# Patient Record
Sex: Male | Born: 1955
Health system: Southern US, Community
[De-identification: ages and names within clinical notes are randomized; demographics above are authoritative.]

## PROBLEM LIST (undated history)

## (undated) DIAGNOSIS — R9389 Abnormal findings on diagnostic imaging of other specified body structures: Secondary | ICD-10-CM

## (undated) DIAGNOSIS — E119 Type 2 diabetes mellitus without complications: Secondary | ICD-10-CM

## (undated) DIAGNOSIS — K219 Gastro-esophageal reflux disease without esophagitis: Secondary | ICD-10-CM

## (undated) DIAGNOSIS — IMO0002 Reserved for concepts with insufficient information to code with codable children: Secondary | ICD-10-CM

## (undated) DIAGNOSIS — H409 Unspecified glaucoma: Secondary | ICD-10-CM

## (undated) DIAGNOSIS — M5440 Lumbago with sciatica, unspecified side: Secondary | ICD-10-CM

## (undated) DIAGNOSIS — N189 Chronic kidney disease, unspecified: Secondary | ICD-10-CM

## (undated) DIAGNOSIS — N400 Enlarged prostate without lower urinary tract symptoms: Secondary | ICD-10-CM

## (undated) DIAGNOSIS — M329 Systemic lupus erythematosus, unspecified: Secondary | ICD-10-CM

## (undated) DIAGNOSIS — Z87442 Personal history of urinary calculi: Secondary | ICD-10-CM

## (undated) DIAGNOSIS — M359 Systemic involvement of connective tissue, unspecified: Secondary | ICD-10-CM

## (undated) DIAGNOSIS — I10 Essential (primary) hypertension: Secondary | ICD-10-CM

## (undated) DIAGNOSIS — C801 Malignant (primary) neoplasm, unspecified: Secondary | ICD-10-CM

## (undated) DIAGNOSIS — M1990 Unspecified osteoarthritis, unspecified site: Secondary | ICD-10-CM

## (undated) HISTORY — DX: Malignant (primary) neoplasm, unspecified: C80.1

## (undated) HISTORY — DX: Lumbago with sciatica, unspecified side: M54.40

## (undated) HISTORY — DX: Unspecified osteoarthritis, unspecified site: M19.90

## (undated) HISTORY — PX: TUMOR REMOVAL: SHX12

## (undated) HISTORY — PX: FOOT SURGERY: SHX648

## (undated) HISTORY — DX: Type 2 diabetes mellitus without complications: E11.9

## (undated) HISTORY — PX: COLONOSCOPY W/ POLYPECTOMY: SHX1380

---

## 2004-01-18 ENCOUNTER — Emergency Department: Payer: Self-pay | Admitting: Internal Medicine

## 2004-02-09 ENCOUNTER — Emergency Department: Payer: Self-pay | Admitting: Internal Medicine

## 2004-11-20 ENCOUNTER — Emergency Department: Payer: Self-pay | Admitting: Emergency Medicine

## 2005-06-14 ENCOUNTER — Emergency Department: Payer: Self-pay | Admitting: Emergency Medicine

## 2005-09-12 ENCOUNTER — Emergency Department: Payer: Self-pay | Admitting: Emergency Medicine

## 2005-09-25 ENCOUNTER — Emergency Department: Payer: Self-pay | Admitting: Emergency Medicine

## 2005-09-25 ENCOUNTER — Other Ambulatory Visit: Payer: Self-pay

## 2005-09-26 ENCOUNTER — Emergency Department: Payer: Self-pay | Admitting: Emergency Medicine

## 2006-06-21 ENCOUNTER — Emergency Department: Payer: Self-pay | Admitting: Emergency Medicine

## 2006-10-06 ENCOUNTER — Emergency Department: Payer: Self-pay | Admitting: Emergency Medicine

## 2006-11-04 ENCOUNTER — Inpatient Hospital Stay: Payer: Self-pay | Admitting: *Deleted

## 2006-11-04 ENCOUNTER — Other Ambulatory Visit: Payer: Self-pay

## 2006-12-23 ENCOUNTER — Emergency Department: Payer: Self-pay | Admitting: Emergency Medicine

## 2007-03-19 ENCOUNTER — Ambulatory Visit: Payer: Self-pay | Admitting: Gastroenterology

## 2007-05-18 ENCOUNTER — Ambulatory Visit: Payer: Self-pay | Admitting: Gastroenterology

## 2007-07-15 ENCOUNTER — Ambulatory Visit: Payer: Self-pay

## 2007-07-27 ENCOUNTER — Ambulatory Visit: Payer: Self-pay | Admitting: Rheumatology

## 2007-07-29 ENCOUNTER — Ambulatory Visit: Payer: Self-pay | Admitting: Rheumatology

## 2007-09-23 ENCOUNTER — Ambulatory Visit: Payer: Self-pay | Admitting: Rheumatology

## 2008-07-11 ENCOUNTER — Emergency Department: Payer: Self-pay | Admitting: Emergency Medicine

## 2008-08-01 ENCOUNTER — Ambulatory Visit: Payer: Self-pay | Admitting: Urology

## 2008-12-05 ENCOUNTER — Emergency Department: Payer: Self-pay | Admitting: Emergency Medicine

## 2008-12-07 ENCOUNTER — Emergency Department: Payer: Self-pay | Admitting: Emergency Medicine

## 2009-03-07 ENCOUNTER — Ambulatory Visit: Payer: Self-pay | Admitting: Unknown Physician Specialty

## 2009-04-04 ENCOUNTER — Ambulatory Visit: Payer: Self-pay | Admitting: Orthopedic Surgery

## 2009-05-09 ENCOUNTER — Ambulatory Visit: Payer: Self-pay | Admitting: Pain Medicine

## 2009-05-24 ENCOUNTER — Ambulatory Visit: Payer: Self-pay | Admitting: Pain Medicine

## 2009-06-02 ENCOUNTER — Emergency Department: Payer: Self-pay | Admitting: Emergency Medicine

## 2009-06-05 ENCOUNTER — Emergency Department: Payer: Self-pay | Admitting: Emergency Medicine

## 2009-06-07 ENCOUNTER — Ambulatory Visit: Payer: Self-pay | Admitting: Pain Medicine

## 2009-06-12 ENCOUNTER — Ambulatory Visit: Payer: Self-pay | Admitting: General Practice

## 2009-06-18 ENCOUNTER — Ambulatory Visit: Payer: Self-pay | Admitting: Pain Medicine

## 2009-06-25 ENCOUNTER — Ambulatory Visit: Payer: Self-pay | Admitting: Endocrinology

## 2009-09-05 ENCOUNTER — Emergency Department: Payer: Self-pay | Admitting: Internal Medicine

## 2010-03-11 ENCOUNTER — Ambulatory Visit: Payer: Self-pay | Admitting: Gastroenterology

## 2010-03-13 ENCOUNTER — Ambulatory Visit: Payer: Self-pay | Admitting: Rheumatology

## 2010-03-21 ENCOUNTER — Ambulatory Visit: Payer: Self-pay | Admitting: Rheumatology

## 2011-04-13 ENCOUNTER — Emergency Department: Payer: Self-pay | Admitting: *Deleted

## 2011-04-13 LAB — CBC
HCT: 37.3 % — ABNORMAL LOW (ref 40.0–52.0)
HGB: 12.5 g/dL — ABNORMAL LOW (ref 13.0–18.0)
MCH: 31.3 pg (ref 26.0–34.0)
MCHC: 33.5 g/dL (ref 32.0–36.0)
MCV: 93 fL (ref 80–100)
Platelet: 169 10*3/uL (ref 150–440)
RBC: 4 10*6/uL — ABNORMAL LOW (ref 4.40–5.90)
RDW: 12.6 % (ref 11.5–14.5)
WBC: 6.8 10*3/uL (ref 3.8–10.6)

## 2011-04-13 LAB — COMPREHENSIVE METABOLIC PANEL
Albumin: 4 g/dL (ref 3.4–5.0)
Alkaline Phosphatase: 69 U/L (ref 50–136)
Anion Gap: 9 (ref 7–16)
BUN: 12 mg/dL (ref 7–18)
Bilirubin,Total: 0.7 mg/dL (ref 0.2–1.0)
Calcium, Total: 8.7 mg/dL (ref 8.5–10.1)
Chloride: 107 mmol/L (ref 98–107)
Co2: 28 mmol/L (ref 21–32)
Creatinine: 1.06 mg/dL (ref 0.60–1.30)
EGFR (African American): >60
EGFR (Non-African Amer.): >60
Glucose: 94 mg/dL (ref 65–99)
Osmolality: 286 (ref 275–301)
Potassium: 3.5 mmol/L (ref 3.5–5.1)
SGOT(AST): 21 U/L (ref 15–37)
SGPT (ALT): 17 U/L
Sodium: 144 mmol/L (ref 136–145)
Total Protein: 7.8 g/dL (ref 6.4–8.2)

## 2011-04-13 LAB — TROPONIN I: Troponin-I: <0.02 ng/mL

## 2011-04-14 LAB — URINALYSIS, COMPLETE
Bacteria: NONE SEEN
Bilirubin,UR: NEGATIVE
Blood: NEGATIVE
Glucose,UR: NEGATIVE mg/dL (ref 0–75)
Leukocyte Esterase: NEGATIVE
Nitrite: NEGATIVE
Ph: 6 (ref 4.5–8.0)
Protein: 30
RBC,UR: 1 /HPF (ref 0–5)
Specific Gravity: 1.027 (ref 1.003–1.030)
Squamous Epithelial: <1
WBC UR: 1 /HPF (ref 0–5)

## 2011-05-06 ENCOUNTER — Emergency Department: Payer: Self-pay | Admitting: Emergency Medicine

## 2011-06-09 ENCOUNTER — Ambulatory Visit: Payer: Self-pay | Admitting: Gastroenterology

## 2011-06-09 LAB — HEPATIC FUNCTION PANEL A (ARMC)
Albumin: 4 g/dL (ref 3.4–5.0)
Alkaline Phosphatase: 72 U/L (ref 50–136)
Bilirubin, Direct: <0.1 mg/dL (ref 0.00–0.20)
Bilirubin,Total: 0.5 mg/dL (ref 0.2–1.0)
SGOT(AST): 20 U/L (ref 15–37)
SGPT (ALT): 22 U/L
Total Protein: 7.8 g/dL (ref 6.4–8.2)

## 2011-06-10 LAB — PATHOLOGY REPORT

## 2011-06-20 ENCOUNTER — Emergency Department: Payer: Self-pay | Admitting: Emergency Medicine

## 2011-06-20 LAB — URINALYSIS, COMPLETE
Bacteria: NONE SEEN
Bilirubin,UR: NEGATIVE
Glucose,UR: NEGATIVE mg/dL (ref 0–75)
Ketone: NEGATIVE
Leukocyte Esterase: NEGATIVE
Nitrite: NEGATIVE
Ph: 8 (ref 4.5–8.0)
Protein: NEGATIVE
RBC,UR: 396 /HPF (ref 0–5)
Specific Gravity: 1.018 (ref 1.003–1.030)
Squamous Epithelial: 1
WBC UR: 1 /HPF (ref 0–5)

## 2012-03-18 ENCOUNTER — Ambulatory Visit: Payer: Self-pay | Admitting: Urology

## 2013-04-11 DIAGNOSIS — D170 Benign lipomatous neoplasm of skin and subcutaneous tissue of head, face and neck: Secondary | ICD-10-CM | POA: Insufficient documentation

## 2013-04-11 DIAGNOSIS — N200 Calculus of kidney: Secondary | ICD-10-CM | POA: Insufficient documentation

## 2013-04-11 DIAGNOSIS — M329 Systemic lupus erythematosus, unspecified: Secondary | ICD-10-CM | POA: Insufficient documentation

## 2013-04-11 DIAGNOSIS — H409 Unspecified glaucoma: Secondary | ICD-10-CM | POA: Insufficient documentation

## 2013-04-11 DIAGNOSIS — D1720 Benign lipomatous neoplasm of skin and subcutaneous tissue of unspecified limb: Secondary | ICD-10-CM | POA: Insufficient documentation

## 2013-04-11 DIAGNOSIS — H209 Unspecified iridocyclitis: Secondary | ICD-10-CM | POA: Insufficient documentation

## 2013-04-11 DIAGNOSIS — E1122 Type 2 diabetes mellitus with diabetic chronic kidney disease: Secondary | ICD-10-CM | POA: Insufficient documentation

## 2013-04-11 DIAGNOSIS — IMO0002 Reserved for concepts with insufficient information to code with codable children: Secondary | ICD-10-CM | POA: Insufficient documentation

## 2014-10-07 HISTORY — PX: PROSTATE BIOPSY: SHX241

## 2014-10-16 ENCOUNTER — Ambulatory Visit: Payer: Self-pay | Admitting: Physician Assistant

## 2014-10-18 ENCOUNTER — Encounter: Payer: Self-pay | Admitting: Vascular Surgery

## 2014-10-18 ENCOUNTER — Telehealth: Payer: Self-pay | Admitting: Vascular Surgery

## 2014-10-18 NOTE — Telephone Encounter (Signed)
-----   Message from Denman George, RN sent at 10/17/2014 12:12 PM EDT ----- Regarding: needs consult with Dr. Scot Dock Please schedule this pt. for new pt. Consult with Dr. Scot Dock, prior to ALIF on 11/09/14.  Dr. Scot Dock will be assisting Dr. [name] with this procedure on 11/09/14.  Please remind pt. to bring a CD-Rom of his LS Spine films.

## 2014-10-18 NOTE — Telephone Encounter (Signed)
Spoke with pt to r/s.

## 2014-10-19 ENCOUNTER — Encounter: Payer: Self-pay | Admitting: Vascular Surgery

## 2014-10-19 ENCOUNTER — Other Ambulatory Visit: Payer: Self-pay

## 2014-10-20 ENCOUNTER — Ambulatory Visit (INDEPENDENT_AMBULATORY_CARE_PROVIDER_SITE_OTHER): Payer: Self-pay | Admitting: Vascular Surgery

## 2014-10-20 ENCOUNTER — Encounter: Payer: Self-pay | Admitting: Vascular Surgery

## 2014-10-20 VITALS — BP 138/89 | HR 79 | Ht 71.0 in | Wt 200.0 lb

## 2014-10-20 DIAGNOSIS — M5137 Other intervertebral disc degeneration, lumbosacral region: Secondary | ICD-10-CM

## 2014-10-20 NOTE — Progress Notes (Signed)
[facility] [place]  Patient name: Nicholas Vazquez. MRN: [id number] DOB: 02-17-1955 Sex: male  REASON FOR CONSULT: Evaluate for anterior retroperitoneal exposure of L5-S1. Referred by Dr. [name].   HPI: Nicholas Vazquez. is a 59 y.o. male, who is deferred for evaluation for anterior retroperitoneal exposure of L5-S1 for an ALIF by Dr. [name]. He injured his back pushing the palate in April of this year. He's had back pain and also right sided leg pain and weakness. He has tried injection therapy, physical therapy, and pain management which has been unsuccessful. He was felt to be a good candidate for anterior lumbar interbody fusion at the L5-S1 level and we were asked to provide anterior retroperitoneal exposure.   He denies any history of claudication or rest pain. He has not had any previous abdominal surgery.  Past Medical History  Diagnosis Date  . Acute back pain with sciatica     Midline Low back- Right-side  . Diabetes mellitus without complication ([place])     Type II  . Arthritis     Lumbosacral DDD and  Rheumatoid  . Kidney stone   . Cancer [facility])     Family History  Problem Relation Age of Onset  . Diabetes Mother   . Cancer Father     SOCIAL HISTORY: Social History   Social History  . Marital Status: Single    Spouse Name: N/A  . Number of Children: N/A  . Years of Education: N/A   Occupational History  . Not on file.   Social History Main Topics  . Smoking status: Former Smoker -- 0.25 packs/day    Types: Cigarettes  . Smokeless tobacco: Never Used  . Alcohol Use: No  . Drug Use: No  . Sexual Activity: Not on file   Other Topics Concern  . Not on file   Social History Narrative    Allergies  Allergen Reactions  . Sulfabenzamide     Current Outpatient Prescriptions  Medication Sig Dispense Refill  . BRIMONIDINE TARTRATE OP Apply 0.2 % to eye 2 (two) times daily.    . cyclobenzaprine (FLEXERIL) 5 MG tablet  Take 5 mg by mouth 3 (three) times daily as needed for muscle spasms.    . Dorzolamide HCl-Timolol Mal PF 22.3-6.8 MG/ML SOLN Apply to eye.    . hydroxychloroquine (PLAQUENIL) 200 MG tablet Take 200 mg by mouth 2 (two) times daily. Sulfate  5  . latanoprost (XALATAN) 0.005 % ophthalmic solution 1 drop at bedtime.    . naproxen (NAPROSYN) 500 MG tablet TAKE ONE TABLET BY MOUTH TWICE DAILY WITH MEAL  5  . omeprazole (PRILOSEC) 40 MG capsule Take 40 mg by mouth daily.  5  . tamsulosin (FLOMAX) 0.4 MG CAPS capsule TAKE 1 CAPSULE (0.4 MG TOTAL) BY MOUTH ONCE DAILY. TAKE 30 MINUTES AFTER SAME MEAL EACH DAY.  0  . traMADol (ULTRAM) 50 MG tablet Take 50 mg by mouth 2 (two) times daily as needed. for pain  1   No current facility-administered medications for this visit.    REVIEW OF SYSTEMS:  [X]  denotes positive finding, [ ]  denotes negative finding Cardiac  Comments:  Chest pain or chest pressure:    Shortness of breath upon exertion:    Short of breath when lying flat:    Irregular heart rhythm:        Vascular    Pain in calf, thigh, or hip brought on by ambulation:    Pain  in feet at night that wakes you up from your sleep:     Blood clot in your veins:    Leg swelling:         Pulmonary    Oxygen at home:    Productive cough:     Wheezing:         Neurologic    Sudden weakness in arms or legs:     Sudden numbness in arms or legs:     Sudden onset of difficulty speaking or slurred speech:    Temporary loss of vision in one eye:     Problems with dizziness:         Gastrointestinal    Blood in stool:     Vomited blood:         Genitourinary    Burning when urinating:     Blood in urine:        Psychiatric    Major depression:         Hematologic    Bleeding problems:    Problems with blood clotting too easily:        Skin    Rashes or ulcers:        Constitutional    Fever or chills:      PHYSICAL EXAM: There were no vitals filed for this visit.  GENERAL: The  patient is a well-nourished male, in no acute distress. The vital signs are documented above. CARDIAC: There is a regular rate and rhythm.  VASCULAR: I do not detect carotid bruits. He has palpable femoral pulses and palpable pedal pulses bilaterally. He has no significant lower extremity swelling. PULMONARY: There is good air exchange bilaterally without wheezing or rales. ABDOMEN: Soft and non-tender with normal pitched bowel sounds.  MUSCULOSKELETAL: There are no major deformities or cyanosis. NEUROLOGIC: No focal weakness or paresthesias are detected. SKIN: There are no ulcers or rashes noted. PSYCHIATRIC: The patient has a normal affect.  DATA:  I have reviewed his MRI. I do not see any calcific disease in the iliac vessels. He has a moderate disc bulge at L5-S1.  MEDICAL ISSUES:  DEGENERATIVE DISC DISEASE L5-S1: He appears to be a good candidate for anterior rectoperineal exposure of L5-S1. I have reviewed our role in exposure of the spine in order to allow anterior lumbar interbody fusion at the appropriate levels. We have discussed the potential complications of surgery, including but not limited to, arterial or venous injury, thrombosis, or bleeding. We have also discussed the potential risks of wound healing problems, the development of a hernia, nerve injury, leg swelling, or other unpredictable medical problems. I've also explained that for the L5-S1 level there is a small risk of retrograde ejaculation. All the patient's questions were answered and they are agreeable to proceed.  [name] [facility] [place]: [phone]

## 2014-10-30 NOTE — Pre-Procedure Instructions (Signed)
Abdiaziz Klahn.  10/30/2014     Your procedure is scheduled on : Thursday November 09, 2014 at 8:30 AM.  Report to [facility] Admitting at 6:30 A.M.  Call this number if you have problems the morning of surgery: [phone]    Remember:  Do not eat food or drink liquids after midnight.  Take these medicines the morning of surgery with A SIP OF WATER : Omeprazole (Prilosec), Tramadol (Ultram) if needed, Eye drops   Stop taking any vitamins, herbal medications, Naproxen/Aleve, Ibuprofen, Advil, Motrin, Aleve, etc on Thursday October 27th   Do not wear jewelry.  Do not wear lotions, powders, or cologne.    Men may shave face and neck.  Do not bring valuables to the hospital.  [facility] is not responsible for any belongings or valuables.  Contacts, dentures or bridgework may not be worn into surgery.  Leave your suitcase in the car.  After surgery it may be brought to your room.  For patients admitted to the hospital, discharge time will be determined by your treatment team.  Patients discharged the day of surgery will not be allowed to drive home.   Name and phone number of your driver:    Special instructions:  Shower using CHG soap the night before and the morning of your surgery  Please read over the following fact sheets that you were given. Pain Booklet, Coughing and Deep Breathing, MRSA Information and Surgical Site Infection Prevention

## 2014-10-31 ENCOUNTER — Encounter (HOSPITAL_COMMUNITY)
Admission: RE | Admit: 2014-10-31 | Discharge: 2014-10-31 | Disposition: A | Discharge status: Home / Self Care | Payer: Worker's Compensation | Source: Ambulatory Visit | Attending: Orthopedic Surgery | Admitting: Orthopedic Surgery

## 2014-10-31 ENCOUNTER — Encounter (HOSPITAL_COMMUNITY): Payer: Self-pay

## 2014-10-31 DIAGNOSIS — Z01818 Encounter for other preprocedural examination: Secondary | ICD-10-CM | POA: Diagnosis present

## 2014-10-31 DIAGNOSIS — K219 Gastro-esophageal reflux disease without esophagitis: Secondary | ICD-10-CM | POA: Insufficient documentation

## 2014-10-31 DIAGNOSIS — H409 Unspecified glaucoma: Secondary | ICD-10-CM | POA: Diagnosis not present

## 2014-10-31 DIAGNOSIS — Z79899 Other long term (current) drug therapy: Secondary | ICD-10-CM | POA: Diagnosis not present

## 2014-10-31 DIAGNOSIS — Z01812 Encounter for preprocedural laboratory examination: Secondary | ICD-10-CM | POA: Insufficient documentation

## 2014-10-31 DIAGNOSIS — Z0183 Encounter for blood typing: Secondary | ICD-10-CM | POA: Diagnosis not present

## 2014-10-31 DIAGNOSIS — Z87891 Personal history of nicotine dependence: Secondary | ICD-10-CM | POA: Insufficient documentation

## 2014-10-31 DIAGNOSIS — E119 Type 2 diabetes mellitus without complications: Secondary | ICD-10-CM | POA: Diagnosis not present

## 2014-10-31 DIAGNOSIS — M5137 Other intervertebral disc degeneration, lumbosacral region: Secondary | ICD-10-CM | POA: Diagnosis not present

## 2014-10-31 HISTORY — DX: Systemic lupus erythematosus, unspecified: M32.9

## 2014-10-31 HISTORY — DX: Benign prostatic hyperplasia without lower urinary tract symptoms: N40.0

## 2014-10-31 HISTORY — DX: Unspecified glaucoma: H40.9

## 2014-10-31 HISTORY — DX: Reserved for concepts with insufficient information to code with codable children: IMO0002

## 2014-10-31 HISTORY — DX: Personal history of urinary calculi: Z87.442

## 2014-10-31 HISTORY — DX: Gastro-esophageal reflux disease without esophagitis: K21.9

## 2014-10-31 LAB — SURGICAL PCR SCREEN
MRSA, PCR: NEGATIVE
Staphylococcus aureus: NEGATIVE

## 2014-10-31 LAB — BASIC METABOLIC PANEL
Anion gap: 7 (ref 5–15)
BUN: 16 mg/dL (ref 6–20)
CO2: 29 mmol/L (ref 22–32)
Calcium: 9.2 mg/dL (ref 8.9–10.3)
Chloride: 106 mmol/L (ref 101–111)
Creatinine, Ser: 1.13 mg/dL (ref 0.61–1.24)
GFR calc Af Amer: >60 mL/min (ref >60)
GFR calc non Af Amer: >60 mL/min (ref >60)
Glucose, Bld: 128 mg/dL — ABNORMAL HIGH (ref 65–99)
Potassium: 4 mmol/L (ref 3.5–5.1)
Sodium: 142 mmol/L (ref 135–145)

## 2014-10-31 LAB — CBC
HCT: 37.3 % — ABNORMAL LOW (ref 39.0–52.0)
Hemoglobin: 12.8 g/dL — ABNORMAL LOW (ref 13.0–17.0)
MCH: 31.2 pg (ref 26.0–34.0)
MCHC: 34.3 g/dL (ref 30.0–36.0)
MCV: 91 fL (ref 78.0–100.0)
Platelets: 170 10*3/uL (ref 150–400)
RBC: 4.1 MIL/uL — ABNORMAL LOW (ref 4.22–5.81)
RDW: 12.7 % (ref 11.5–15.5)
WBC: 4.8 10*3/uL (ref 4.0–10.5)

## 2014-10-31 LAB — URINALYSIS, ROUTINE W REFLEX MICROSCOPIC
Bilirubin Urine: NEGATIVE
Glucose, UA: NEGATIVE mg/dL
Ketones, ur: NEGATIVE mg/dL
Leukocytes, UA: NEGATIVE
Nitrite: NEGATIVE
Protein, ur: NEGATIVE mg/dL
Specific Gravity, Urine: 1.019 (ref 1.005–1.030)
Urobilinogen, UA: 1 mg/dL (ref 0.0–1.0)
pH: 6 (ref 5.0–8.0)

## 2014-10-31 LAB — URINE MICROSCOPIC-ADD ON

## 2014-10-31 LAB — TYPE AND SCREEN
ABO/RH(D): O POS
Antibody Screen: NEGATIVE

## 2014-10-31 LAB — ABO/RH: ABO/RH(D): O POS

## 2014-10-31 NOTE — Progress Notes (Signed)
PCP is Frazier Richards. Patient informed Nurse that he had a office visit with PCP a few weeks ago. Patient informed Nurse that he had a stress test within the last five years. Will request records. Patient denied having any cardiac or pulmonary issues  Dr. Yves Dill is patients urologist. Patient informed Nurse that he recently had a prostate biopsy performed and had not received the results back. Patient stated his father died last year in this month from prostate cancer.  Patient informed Nurse that he had a UTI last week and was given antibiotics to take. Urinalysis was ordered today.  Patient denied having diabetes. Patient stated "I was getting those steroid injections, and that made my sugar go up. I was on Metformin but after my doctor stopped the injections my sugar was normal, and I was taken off the Metformin.Marland KitchenMarland KitchenI've never had any issues with my blood sugar.Marland KitchenMarland KitchenMarland KitchenI mean I'm not over weight or anything." Patient stated he does not check his blood glucose at home."

## 2014-11-01 LAB — HEMOGLOBIN A1C
Hgb A1c MFr Bld: 5.9 % — ABNORMAL HIGH (ref 4.8–5.6)
Mean Plasma Glucose: 123 mg/dL

## 2014-11-01 NOTE — Progress Notes (Addendum)
Anesthesia Chart Review:  Pt is 60 year old male scheduled for L5-S1 anterior lumbar fusion, abdominal exposure on 11/09/2014 with Dr. [name] and Dr. Scot Dock.   PMH includes: DM, lupus, glaucoma, GERD.  Former smoker. BMI 27.5.  Medications include: brimonidine ophthalmic, dorzolamide-timolol, hydroxychloroquine, latanoprost, prilosec.   Preoperative labs reviewed.  Glucose 128, hgbA1c 5.9.   EKG 10/31/2014: NSR.   Pt reports having stress test about 5 years ago. Attempting to get records.   Willeen Cass, FNP-BC [facility]/Anesthesiology Phone: [phone] 11/01/2014 4:15 PM  Addendum:   Stress echo test 04/24/11: 1. Normal treadmill ECG without evidence of ischemia or dysrhythmia 2. Excellent exercise tolerance for age 65. Normal stress echo images without evidence of ischemia  If no changes, I anticipate pt can proceed with surgery as scheduled.   Willeen Cass, FNP-BC [facility]/Anesthesiology Phone: [phone] 11/08/2014 2:28 PM

## 2014-11-02 ENCOUNTER — Encounter: Payer: Self-pay | Admitting: Orthopedic Surgery

## 2014-11-06 NOTE — Progress Notes (Signed)
Re-requested stress test from [facility] (Dr. Ouida Sills).

## 2014-11-07 DIAGNOSIS — R9389 Abnormal findings on diagnostic imaging of other specified body structures: Secondary | ICD-10-CM

## 2014-11-07 HISTORY — DX: Abnormal findings on diagnostic imaging of other specified body structures: R93.89

## 2014-11-08 MED ORDER — CHLORHEXIDINE GLUCONATE 4 % EX LIQD: 60.0000 mL | Freq: Once | CUTANEOUS | Status: DC

## 2014-11-08 MED ORDER — CEFAZOLIN SODIUM-DEXTROSE 2-3 GM-% IV SOLR
2.0000 g | INTRAVENOUS | Status: AC
  Administered 2014-11-09: 2 g via INTRAVENOUS
  Filled 2014-11-08: qty 50

## 2014-11-09 ENCOUNTER — Inpatient Hospital Stay (HOSPITAL_COMMUNITY): Payer: Worker's Compensation

## 2014-11-09 ENCOUNTER — Encounter (HOSPITAL_COMMUNITY)
Admission: RE | Disposition: A | Discharge status: Home Health | Payer: Self-pay | Source: Ambulatory Visit | Attending: Orthopedic Surgery

## 2014-11-09 ENCOUNTER — Inpatient Hospital Stay (HOSPITAL_COMMUNITY)
Admission: RE | Admit: 2014-11-09 | Discharge: 2014-11-12 | DRG: 460 | Disposition: A | Discharge status: Home Health | Payer: Worker's Compensation | Source: Ambulatory Visit | Attending: Orthopedic Surgery | Admitting: Orthopedic Surgery

## 2014-11-09 ENCOUNTER — Encounter (HOSPITAL_COMMUNITY): Payer: Self-pay | Admitting: Surgery

## 2014-11-09 ENCOUNTER — Inpatient Hospital Stay (HOSPITAL_COMMUNITY): Payer: Worker's Compensation | Admitting: Emergency Medicine

## 2014-11-09 ENCOUNTER — Inpatient Hospital Stay (HOSPITAL_COMMUNITY): Payer: Worker's Compensation | Admitting: Anesthesiology

## 2014-11-09 DIAGNOSIS — M5137 Other intervertebral disc degeneration, lumbosacral region: Principal | ICD-10-CM | POA: Diagnosis present

## 2014-11-09 DIAGNOSIS — M069 Rheumatoid arthritis, unspecified: Secondary | ICD-10-CM | POA: Diagnosis present

## 2014-11-09 DIAGNOSIS — M5136 Other intervertebral disc degeneration, lumbar region: Secondary | ICD-10-CM

## 2014-11-09 DIAGNOSIS — M549 Dorsalgia, unspecified: Secondary | ICD-10-CM | POA: Diagnosis present

## 2014-11-09 DIAGNOSIS — Z419 Encounter for procedure for purposes other than remedying health state, unspecified: Secondary | ICD-10-CM

## 2014-11-09 DIAGNOSIS — L930 Discoid lupus erythematosus: Secondary | ICD-10-CM | POA: Diagnosis present

## 2014-11-09 DIAGNOSIS — Z87891 Personal history of nicotine dependence: Secondary | ICD-10-CM

## 2014-11-09 DIAGNOSIS — Z9889 Other specified postprocedural states: Secondary | ICD-10-CM

## 2014-11-09 DIAGNOSIS — E119 Type 2 diabetes mellitus without complications: Secondary | ICD-10-CM | POA: Diagnosis present

## 2014-11-09 DIAGNOSIS — M5430 Sciatica, unspecified side: Secondary | ICD-10-CM | POA: Diagnosis present

## 2014-11-09 HISTORY — DX: Abnormal findings on diagnostic imaging of other specified body structures: R93.89

## 2014-11-09 HISTORY — PX: ANTERIOR LUMBAR FUSION: SHX1170

## 2014-11-09 HISTORY — PX: ABDOMINAL EXPOSURE: SHX5708

## 2014-11-09 LAB — GLUCOSE, CAPILLARY
Glucose-Capillary: 156 mg/dL — ABNORMAL HIGH (ref 65–99)
Glucose-Capillary: 120 mg/dL — ABNORMAL HIGH (ref 65–99)

## 2014-11-09 SURGERY — ANTERIOR LUMBAR FUSION 1 LEVEL
Anesthesia: General | Site: Spine Lumbar

## 2014-11-09 MED ORDER — SODIUM CHLORIDE 0.9 % IJ SOLN
3.0000 mL | INTRAMUSCULAR | Status: DC | PRN
  Administered 2014-11-10 – 2014-11-11 (×2): 3 mL via INTRAVENOUS
  Filled 2014-11-09 (×2): qty 3

## 2014-11-09 MED ORDER — DEXTROSE 5 % IV SOLN
10.0000 mg | INTRAVENOUS | Status: DC | PRN
  Administered 2014-11-09: 40 ug/min via INTRAVENOUS

## 2014-11-09 MED ORDER — FENTANYL CITRATE (PF) 250 MCG/5ML IJ SOLN
INTRAMUSCULAR | Status: AC
  Filled 2014-11-09: qty 5

## 2014-11-09 MED ORDER — GLYCOPYRROLATE 0.2 MG/ML IJ SOLN
INTRAMUSCULAR | Status: DC | PRN
  Administered 2014-11-09 (×2): 0.4 mg via INTRAVENOUS

## 2014-11-09 MED ORDER — TAMSULOSIN HCL 0.4 MG PO CAPS
0.4000 mg | ORAL_CAPSULE | Freq: Every day | ORAL | Status: DC
  Administered 2014-11-09 – 2014-11-12 (×4): 0.4 mg via ORAL
  Filled 2014-11-09 (×4): qty 1

## 2014-11-09 MED ORDER — ONDANSETRON HCL 4 MG/2ML IJ SOLN: 4.0000 mg | INTRAMUSCULAR | Status: DC | PRN

## 2014-11-09 MED ORDER — DEXAMETHASONE 4 MG PO TABS
4.0000 mg | ORAL_TABLET | Freq: Four times a day (QID) | ORAL | Status: DC
  Administered 2014-11-09: 4 mg via ORAL
  Filled 2014-11-09: qty 1

## 2014-11-09 MED ORDER — PHENOL 1.4 % MT LIQD
1.0000 | OROMUCOSAL | Status: DC | PRN
  Administered 2014-11-09: 1 via OROMUCOSAL
  Filled 2014-11-09: qty 177

## 2014-11-09 MED ORDER — THROMBIN 20000 UNITS EX SOLR
CUTANEOUS | Status: DC | PRN
  Administered 2014-11-09: 20 mL via TOPICAL

## 2014-11-09 MED ORDER — MAGNESIUM CITRATE PO SOLN
0.5000 | Freq: Once | ORAL | Status: AC
  Administered 2014-11-10: 0.5 via ORAL
  Filled 2014-11-09: qty 296

## 2014-11-09 MED ORDER — ACETAMINOPHEN 160 MG/5ML PO SOLN
325.0000 mg | ORAL | Status: DC | PRN
  Filled 2014-11-09: qty 20.3

## 2014-11-09 MED ORDER — ACETAMINOPHEN 10 MG/ML IV SOLN
INTRAVENOUS | Status: DC | PRN
  Administered 2014-11-09: 1000 mg via INTRAVENOUS

## 2014-11-09 MED ORDER — METHOCARBAMOL 1000 MG/10ML IJ SOLN
500.0000 mg | Freq: Once | INTRAVENOUS | Status: AC
  Administered 2014-11-09: 500 mg via INTRAVENOUS
  Filled 2014-11-09: qty 5

## 2014-11-09 MED ORDER — ENOXAPARIN SODIUM 30 MG/0.3ML ~~LOC~~ SOLN: 30.0000 mg | Freq: Two times a day (BID) | SUBCUTANEOUS | Status: DC

## 2014-11-09 MED ORDER — OXYCODONE-ACETAMINOPHEN 10-325 MG PO TABS: 1.0000 | ORAL_TABLET | ORAL | Status: DC | PRN

## 2014-11-09 MED ORDER — OXYCODONE HCL 5 MG/5ML PO SOLN: 5.0000 mg | Freq: Once | ORAL | Status: AC | PRN

## 2014-11-09 MED ORDER — LIDOCAINE HCL (CARDIAC) 20 MG/ML IV SOLN
INTRAVENOUS | Status: DC | PRN
  Administered 2014-11-09: 60 mg via INTRAVENOUS

## 2014-11-09 MED ORDER — METHOCARBAMOL 500 MG PO TABS: 500.0000 mg | ORAL_TABLET | Freq: Three times a day (TID) | ORAL | Status: DC | PRN

## 2014-11-09 MED ORDER — ARTIFICIAL TEARS OP OINT
TOPICAL_OINTMENT | OPHTHALMIC | Status: AC
  Filled 2014-11-09: qty 3.5

## 2014-11-09 MED ORDER — HYDROMORPHONE HCL 1 MG/ML IJ SOLN
0.2500 mg | INTRAMUSCULAR | Status: DC | PRN
  Administered 2014-11-09 (×4): 0.5 mg via INTRAVENOUS

## 2014-11-09 MED ORDER — OXYCODONE HCL 5 MG PO TABS
ORAL_TABLET | ORAL | Status: AC
  Filled 2014-11-09: qty 1

## 2014-11-09 MED ORDER — ONDANSETRON HCL 4 MG/2ML IJ SOLN
INTRAMUSCULAR | Status: AC
  Filled 2014-11-09: qty 2

## 2014-11-09 MED ORDER — FLEET ENEMA 7-19 GM/118ML RE ENEM
1.0000 | ENEMA | Freq: Once | RECTAL | Status: AC
  Administered 2014-11-10: 1 via RECTAL
  Filled 2014-11-09: qty 1

## 2014-11-09 MED ORDER — POLYETHYLENE GLYCOL 3350 17 GM/SCOOP PO POWD: 17.0000 g | Freq: Every day | ORAL | Status: DC

## 2014-11-09 MED ORDER — LIDOCAINE HCL (CARDIAC) 20 MG/ML IV SOLN
INTRAVENOUS | Status: AC
  Filled 2014-11-09: qty 5

## 2014-11-09 MED ORDER — HEMOSTATIC AGENTS (NO CHARGE) OPTIME
TOPICAL | Status: DC | PRN
  Administered 2014-11-09: 1 via TOPICAL

## 2014-11-09 MED ORDER — DOCUSATE SODIUM 100 MG PO CAPS: 100.0000 mg | ORAL_CAPSULE | Freq: Three times a day (TID) | ORAL | Status: DC | PRN

## 2014-11-09 MED ORDER — FENTANYL CITRATE (PF) 100 MCG/2ML IJ SOLN
INTRAMUSCULAR | Status: DC | PRN
  Administered 2014-11-09 (×2): 50 ug via INTRAVENOUS
  Administered 2014-11-09 (×2): 100 ug via INTRAVENOUS

## 2014-11-09 MED ORDER — OXYCODONE HCL 5 MG PO TABS
10.0000 mg | ORAL_TABLET | ORAL | Status: DC | PRN
  Administered 2014-11-09 – 2014-11-12 (×14): 10 mg via ORAL
  Filled 2014-11-09 (×14): qty 2

## 2014-11-09 MED ORDER — DEXAMETHASONE SODIUM PHOSPHATE 10 MG/ML IJ SOLN
INTRAMUSCULAR | Status: AC
  Filled 2014-11-09: qty 1

## 2014-11-09 MED ORDER — 0.9 % SODIUM CHLORIDE (POUR BTL) OPTIME
TOPICAL | Status: DC | PRN
  Administered 2014-11-09: 1000 mL

## 2014-11-09 MED ORDER — ALBUMIN HUMAN 5 % IV SOLN
INTRAVENOUS | Status: DC | PRN
  Administered 2014-11-09: 11:00:00 via INTRAVENOUS

## 2014-11-09 MED ORDER — LACTATED RINGERS IV SOLN
INTRAVENOUS | Status: DC
  Administered 2014-11-09: 23:00:00 via INTRAVENOUS

## 2014-11-09 MED ORDER — MIDAZOLAM HCL 5 MG/5ML IJ SOLN
INTRAMUSCULAR | Status: DC | PRN
  Administered 2014-11-09: 0.5 mg via INTRAVENOUS
  Administered 2014-11-09: 1 mg via INTRAVENOUS
  Administered 2014-11-09: 0.5 mg via INTRAVENOUS

## 2014-11-09 MED ORDER — GLYCOPYRROLATE 0.2 MG/ML IJ SOLN
INTRAMUSCULAR | Status: AC
  Filled 2014-11-09: qty 4

## 2014-11-09 MED ORDER — PROPOFOL 10 MG/ML IV BOLUS
INTRAVENOUS | Status: AC
  Filled 2014-11-09: qty 20

## 2014-11-09 MED ORDER — OXYCODONE HCL 5 MG PO TABS
5.0000 mg | ORAL_TABLET | Freq: Once | ORAL | Status: AC | PRN
  Administered 2014-11-09: 5 mg via ORAL

## 2014-11-09 MED ORDER — THROMBIN 20000 UNITS EX SOLR
CUTANEOUS | Status: AC
  Filled 2014-11-09: qty 20000

## 2014-11-09 MED ORDER — ROCURONIUM BROMIDE 100 MG/10ML IV SOLN
INTRAVENOUS | Status: DC | PRN
  Administered 2014-11-09: 10 mg via INTRAVENOUS
  Administered 2014-11-09: 50 mg via INTRAVENOUS
  Administered 2014-11-09 (×4): 10 mg via INTRAVENOUS

## 2014-11-09 MED ORDER — NEOSTIGMINE METHYLSULFATE 10 MG/10ML IV SOLN
INTRAVENOUS | Status: AC
  Filled 2014-11-09: qty 1

## 2014-11-09 MED ORDER — METHOCARBAMOL 500 MG PO TABS
500.0000 mg | ORAL_TABLET | Freq: Four times a day (QID) | ORAL | Status: DC | PRN
  Administered 2014-11-09 – 2014-11-12 (×6): 500 mg via ORAL
  Filled 2014-11-09 (×6): qty 1

## 2014-11-09 MED ORDER — NEOSTIGMINE METHYLSULFATE 10 MG/10ML IV SOLN
INTRAVENOUS | Status: DC | PRN
  Administered 2014-11-09 (×2): 2.5 mg via INTRAVENOUS

## 2014-11-09 MED ORDER — BUPIVACAINE-EPINEPHRINE 0.25% -1:200000 IJ SOLN
INTRAMUSCULAR | Status: DC | PRN
  Administered 2014-11-09: 30 mL

## 2014-11-09 MED ORDER — MORPHINE SULFATE (PF) 2 MG/ML IV SOLN
1.0000 mg | INTRAVENOUS | Status: DC | PRN
  Administered 2014-11-09 – 2014-11-10 (×4): 4 mg via INTRAVENOUS
  Filled 2014-11-09 (×4): qty 2

## 2014-11-09 MED ORDER — MENTHOL 3 MG MT LOZG: 1.0000 | LOZENGE | OROMUCOSAL | Status: DC | PRN

## 2014-11-09 MED ORDER — HYDROMORPHONE HCL 1 MG/ML IJ SOLN
INTRAMUSCULAR | Status: AC
  Filled 2014-11-09: qty 1

## 2014-11-09 MED ORDER — LACTATED RINGERS IV SOLN
INTRAVENOUS | Status: DC | PRN
  Administered 2014-11-09 (×3): via INTRAVENOUS

## 2014-11-09 MED ORDER — ONDANSETRON HCL 4 MG/2ML IJ SOLN
INTRAMUSCULAR | Status: DC | PRN
  Administered 2014-11-09: 4 mg via INTRAVENOUS

## 2014-11-09 MED ORDER — LEVOFLOXACIN 500 MG PO TABS: 500.0000 mg | ORAL_TABLET | Freq: Every day | ORAL | Status: DC

## 2014-11-09 MED ORDER — CEFAZOLIN SODIUM 1-5 GM-% IV SOLN
1.0000 g | Freq: Three times a day (TID) | INTRAVENOUS | Status: AC
  Administered 2014-11-09 (×2): 1 g via INTRAVENOUS
  Filled 2014-11-09 (×2): qty 50

## 2014-11-09 MED ORDER — BUPIVACAINE-EPINEPHRINE (PF) 0.25% -1:200000 IJ SOLN
INTRAMUSCULAR | Status: AC
  Filled 2014-11-09: qty 30

## 2014-11-09 MED ORDER — SODIUM CHLORIDE 0.9 % IV SOLN
250.0000 mL | INTRAVENOUS | Status: DC
Start: 2014-11-09 — End: 2014-11-12
  Administered 2014-11-09: 250 mL via INTRAVENOUS

## 2014-11-09 MED ORDER — ACETAMINOPHEN 325 MG PO TABS: 325.0000 mg | ORAL_TABLET | ORAL | Status: DC | PRN

## 2014-11-09 MED ORDER — METHOCARBAMOL 1000 MG/10ML IJ SOLN
500.0000 mg | Freq: Four times a day (QID) | INTRAVENOUS | Status: DC | PRN
  Filled 2014-11-09: qty 5

## 2014-11-09 MED ORDER — MIDAZOLAM HCL 2 MG/2ML IJ SOLN
INTRAMUSCULAR | Status: AC
  Filled 2014-11-09: qty 4

## 2014-11-09 MED ORDER — LATANOPROST 0.005 % OP SOLN
1.0000 [drp] | Freq: Every day | OPHTHALMIC | Status: DC
  Administered 2014-11-09 – 2014-11-11 (×3): 1 [drp] via OPHTHALMIC
  Filled 2014-11-09: qty 2.5

## 2014-11-09 MED ORDER — HYDROXYCHLOROQUINE SULFATE 200 MG PO TABS
200.0000 mg | ORAL_TABLET | Freq: Two times a day (BID) | ORAL | Status: DC
  Administered 2014-11-09: 200 mg via ORAL
  Filled 2014-11-09: qty 1

## 2014-11-09 MED ORDER — ACETAMINOPHEN 10 MG/ML IV SOLN
1000.0000 mg | Freq: Once | INTRAVENOUS | Status: DC
  Filled 2014-11-09: qty 100

## 2014-11-09 MED ORDER — ROCURONIUM BROMIDE 50 MG/5ML IV SOLN
INTRAVENOUS | Status: AC
  Filled 2014-11-09: qty 2

## 2014-11-09 MED ORDER — DEXAMETHASONE SODIUM PHOSPHATE 10 MG/ML IJ SOLN
INTRAMUSCULAR | Status: DC | PRN
  Administered 2014-11-09: 10 mg via INTRAVENOUS

## 2014-11-09 MED ORDER — DORZOLAMIDE HCL-TIMOLOL MAL 2-0.5 % OP SOLN
1.0000 [drp] | Freq: Every day | OPHTHALMIC | Status: DC
  Administered 2014-11-09: 1 [drp] via OPHTHALMIC
  Filled 2014-11-09: qty 10

## 2014-11-09 MED ORDER — ENOXAPARIN SODIUM 40 MG/0.4ML ~~LOC~~ SOLN
40.0000 mg | SUBCUTANEOUS | Status: DC
  Administered 2014-11-09 – 2014-11-12 (×3): 40 mg via SUBCUTANEOUS
  Filled 2014-11-09 (×3): qty 0.4

## 2014-11-09 MED ORDER — PROPOFOL 10 MG/ML IV BOLUS
INTRAVENOUS | Status: DC | PRN
  Administered 2014-11-09: 140 mg via INTRAVENOUS

## 2014-11-09 MED ORDER — DEXAMETHASONE SODIUM PHOSPHATE 4 MG/ML IJ SOLN
4.0000 mg | Freq: Four times a day (QID) | INTRAMUSCULAR | Status: DC
  Administered 2014-11-09 – 2014-11-12 (×9): 4 mg via INTRAVENOUS
  Filled 2014-11-09 (×10): qty 1

## 2014-11-09 MED ORDER — SODIUM CHLORIDE 0.9 % IJ SOLN
3.0000 mL | Freq: Two times a day (BID) | INTRAMUSCULAR | Status: DC
  Administered 2014-11-10 – 2014-11-11 (×3): 3 mL via INTRAVENOUS

## 2014-11-09 MED ORDER — ONDANSETRON HCL 4 MG PO TABS: 4.0000 mg | ORAL_TABLET | Freq: Three times a day (TID) | ORAL | Status: DC | PRN

## 2014-11-09 MED ORDER — BRIMONIDINE TARTRATE 0.2 % OP SOLN
1.0000 [drp] | Freq: Two times a day (BID) | OPHTHALMIC | Status: DC
  Administered 2014-11-09: 1 [drp] via OPHTHALMIC
  Filled 2014-11-09: qty 5

## 2014-11-09 SURGICAL SUPPLY — 99 items
APPLIER CLIP 11 MED OPEN (CLIP) ×8
BAG DECANTER FOR FLEXI CONT (MISCELLANEOUS) ×4 IMPLANT
BAG URIMETER 350ML BARDEX IC (UROLOGICAL SUPPLIES)
BAG URIMETER BARDEX IC 350 (UROLOGICAL SUPPLIES) IMPLANT
BLADE SURG 10 STRL SS (BLADE) ×4 IMPLANT
BLADE SURG ROTATE 9660 (MISCELLANEOUS) ×4 IMPLANT
BONE MATRIX VIVIGEN 5CC (Bone Implant) ×4 IMPLANT
CLIP APPLIE 11 MED OPEN (CLIP) ×4 IMPLANT
CLOSURE STERI-STRIP 1/2X4 (GAUZE/BANDAGES/DRESSINGS) ×1
CLOSURE WOUND 1/2 X4 (GAUZE/BANDAGES/DRESSINGS)
CLSR STERI-STRIP ANTIMIC 1/2X4 (GAUZE/BANDAGES/DRESSINGS) ×3 IMPLANT
CORDS BIPOLAR (ELECTRODE) ×4 IMPLANT
COVER SURGICAL LIGHT HANDLE (MISCELLANEOUS) ×4 IMPLANT
COVER TABLE BACK 60X90 (DRAPES) ×4 IMPLANT
DECANTER SPIKE VIAL GLASS SM (MISCELLANEOUS) ×4 IMPLANT
DERMABOND ADVANCED (GAUZE/BANDAGES/DRESSINGS) ×2
DERMABOND ADVANCED .7 DNX12 (GAUZE/BANDAGES/DRESSINGS) ×2 IMPLANT
DRAPE C-ARM 42X72 X-RAY (DRAPES) ×8 IMPLANT
DRAPE INCISE IOBAN 66X45 STRL (DRAPES) ×4 IMPLANT
DRAPE POUCH INSTRU U-SHP 10X18 (DRAPES) ×4 IMPLANT
DRAPE SURG 17X23 STRL (DRAPES) ×4 IMPLANT
DRAPE U-SHAPE 47X51 STRL (DRAPES) ×4 IMPLANT
DRSG MEPILEX BORDER 4X8 (GAUZE/BANDAGES/DRESSINGS) ×4 IMPLANT
DURAPREP 26ML APPLICATOR (WOUND CARE) ×4 IMPLANT
ELECT BLADE 4.0 EZ CLEAN MEGAD (MISCELLANEOUS) ×4
ELECT CAUTERY BLADE 6.4 (BLADE) ×4 IMPLANT
ELECT PENCIL ROCKER SW 15FT (MISCELLANEOUS) ×4 IMPLANT
ELECT REM PT RETURN 9FT ADLT (ELECTROSURGICAL) ×4
ELECTRODE BLDE 4.0 EZ CLN MEGD (MISCELLANEOUS) ×2 IMPLANT
ELECTRODE REM PT RTRN 9FT ADLT (ELECTROSURGICAL) ×2 IMPLANT
GAUZE SPONGE 4X4 16PLY XRAY LF (GAUZE/BANDAGES/DRESSINGS) IMPLANT
GLOVE BIO SURGEON STRL SZ7.5 (GLOVE) IMPLANT
GLOVE BIOGEL PI IND STRL 8 (GLOVE) ×4 IMPLANT
GLOVE BIOGEL PI IND STRL 8.5 (GLOVE) ×4 IMPLANT
GLOVE BIOGEL PI INDICATOR 8 (GLOVE) ×4
GLOVE BIOGEL PI INDICATOR 8.5 (GLOVE) ×4
GLOVE ECLIPSE 7.5 STRL STRAW (GLOVE) ×4 IMPLANT
GLOVE ORTHO TXT STRL SZ7.5 (GLOVE) ×4 IMPLANT
GLOVE SS BIOGEL STRL SZ 8.5 (GLOVE) ×4 IMPLANT
GLOVE SUPERSENSE BIOGEL SZ 8.5 (GLOVE) ×4
GOWN STRL REUS W/ TWL LRG LVL3 (GOWN DISPOSABLE) ×8 IMPLANT
GOWN STRL REUS W/TWL 2XL LVL3 (GOWN DISPOSABLE) ×12 IMPLANT
GOWN STRL REUS W/TWL LRG LVL3 (GOWN DISPOSABLE) ×8
HEMOSTAT SNOW SURGICEL 2X4 (HEMOSTASIS) IMPLANT
INSERT FOGARTY 61MM (MISCELLANEOUS) IMPLANT
INSERT FOGARTY SM (MISCELLANEOUS) IMPLANT
INTERPLATE 34X14X12 (Plate) ×4 IMPLANT
KIT BASIN OR (CUSTOM PROCEDURE TRAY) ×4 IMPLANT
KIT ROOM TURNOVER OR (KITS) ×8 IMPLANT
LIQUID BAND (GAUZE/BANDAGES/DRESSINGS) ×4 IMPLANT
LOOP VESSEL MAXI BLUE (MISCELLANEOUS) IMPLANT
LOOP VESSEL MINI RED (MISCELLANEOUS) IMPLANT
NEEDLE SPNL 18GX3.5 QUINCKE PK (NEEDLE) ×4 IMPLANT
NS IRRIG 1000ML POUR BTL (IV SOLUTION) ×12 IMPLANT
PACK LAMINECTOMY ORTHO (CUSTOM PROCEDURE TRAY) ×4 IMPLANT
PACK UNIVERSAL I (CUSTOM PROCEDURE TRAY) ×4 IMPLANT
PAD ARMBOARD 7.5X6 YLW CONV (MISCELLANEOUS) ×16 IMPLANT
PEEK SPACER INTERPLAT 30X14X12 (Peek) ×4 IMPLANT
PLATE SPINAL INTERPLT 34X14X12 (Plate) ×2 IMPLANT
PUTTY BONE DBX 2.5 MIS (Bone Implant) ×4 IMPLANT
SCREW CANCELLOUS 6.5 32MM (Screw) ×4 IMPLANT
SPONGE INTESTINAL PEANUT (DISPOSABLE) ×24 IMPLANT
SPONGE LAP 18X18 X RAY DECT (DISPOSABLE) IMPLANT
SPONGE LAP 4X18 X RAY DECT (DISPOSABLE) IMPLANT
SPONGE SURGIFOAM ABS GEL 100 (HEMOSTASIS) IMPLANT
STAPLER VISISTAT (STAPLE) IMPLANT
STRIP CLOSURE SKIN 1/2X4 (GAUZE/BANDAGES/DRESSINGS) IMPLANT
SURGIFLO W/THROMBIN 8M KIT (HEMOSTASIS) IMPLANT
SUT BONE WAX W31G (SUTURE) ×4 IMPLANT
SUT MON AB 3-0 SH 27 (SUTURE) ×2
SUT MON AB 3-0 SH27 (SUTURE) ×2 IMPLANT
SUT PDS AB 1 CTX 36 (SUTURE) ×12 IMPLANT
SUT PROLENE 4 0 RB 1 (SUTURE)
SUT PROLENE 4-0 RB1 .5 CRCL 36 (SUTURE) IMPLANT
SUT PROLENE 5 0 C 1 24 (SUTURE) ×4 IMPLANT
SUT PROLENE 5 0 CC1 (SUTURE) IMPLANT
SUT PROLENE 6 0 C 1 30 (SUTURE) IMPLANT
SUT PROLENE 6 0 CC (SUTURE) IMPLANT
SUT SILK 0 TIES 10X30 (SUTURE) ×4 IMPLANT
SUT SILK 2 0 TIES 10X30 (SUTURE) ×8 IMPLANT
SUT SILK 2 0SH CR/8 30 (SUTURE) IMPLANT
SUT SILK 3 0 TIES 10X30 (SUTURE) ×8 IMPLANT
SUT SILK 3 0SH CR/8 30 (SUTURE) IMPLANT
SUT VIC AB 0 CT1 27 (SUTURE) ×2
SUT VIC AB 0 CT1 27XBRD ANBCTR (SUTURE) ×2 IMPLANT
SUT VIC AB 1 CT1 27 (SUTURE) ×4
SUT VIC AB 1 CT1 27XBRD ANBCTR (SUTURE) ×4 IMPLANT
SUT VIC AB 2-0 CT1 18 (SUTURE) ×4 IMPLANT
SUT VIC AB 2-0 CTB1 (SUTURE) ×4 IMPLANT
SUT VIC AB 3-0 SH 27 (SUTURE) ×2
SUT VIC AB 3-0 SH 27X BRD (SUTURE) ×2 IMPLANT
SYR BULB IRRIGATION 50ML (SYRINGE) ×4 IMPLANT
SYR CONTROL 10ML LL (SYRINGE) ×8 IMPLANT
TOWEL OR 17X24 6PK STRL BLUE (TOWEL DISPOSABLE) ×4 IMPLANT
TOWEL OR 17X26 10 PK STRL BLUE (TOWEL DISPOSABLE) ×4 IMPLANT
TRAY FOLEY BAG SILVER LF 14FR (CATHETERS) ×4 IMPLANT
TRAY FOLEY CATH 16FR SILVER (SET/KITS/TRAYS/PACK) IMPLANT
WASHER 13.0MM (Orthopedic Implant) ×4 IMPLANT
WATER STERILE IRR 1000ML POUR (IV SOLUTION) IMPLANT

## 2014-11-09 NOTE — H&P (View-Only) (Signed)
[facility] [place]  Patient name: Nicholas Vazquez. MRN: [id number] DOB: 02/10/1955 Sex: male  REASON FOR CONSULT: Evaluate for anterior retroperitoneal exposure of L5-S1. Referred by Dr. [name].   HPI: Nicholas Bifulco. is a 59 y.o. male, who is deferred for evaluation for anterior retroperitoneal exposure of L5-S1 for an ALIF by Dr. [name]. He injured his back pushing the palate in April of this year. He's had back pain and also right sided leg pain and weakness. He has tried injection therapy, physical therapy, and pain management which has been unsuccessful. He was felt to be a good candidate for anterior lumbar interbody fusion at the L5-S1 level and we were asked to provide anterior retroperitoneal exposure.   He denies any history of claudication or rest pain. He has not had any previous abdominal surgery.  Past Medical History  Diagnosis Date  . Acute back pain with sciatica     Midline Low back- Right-side  . Diabetes mellitus without complication ([place])     Type II  . Arthritis     Lumbosacral DDD and  Rheumatoid  . Kidney stone   . Cancer [facility])     Family History  Problem Relation Age of Onset  . Diabetes Mother   . Cancer Father     SOCIAL HISTORY: Social History   Social History  . Marital Status: Single    Spouse Name: N/A  . Number of Children: N/A  . Years of Education: N/A   Occupational History  . Not on file.   Social History Main Topics  . Smoking status: Former Smoker -- 0.25 packs/day    Types: Cigarettes  . Smokeless tobacco: Never Used  . Alcohol Use: No  . Drug Use: No  . Sexual Activity: Not on file   Other Topics Concern  . Not on file   Social History Narrative    Allergies  Allergen Reactions  . Sulfabenzamide     Current Outpatient Prescriptions  Medication Sig Dispense Refill  . BRIMONIDINE TARTRATE OP Apply 0.2 % to eye 2 (two) times daily.    . cyclobenzaprine (FLEXERIL) 5 MG tablet  Take 5 mg by mouth 3 (three) times daily as needed for muscle spasms.    . Dorzolamide HCl-Timolol Mal PF 22.3-6.8 MG/ML SOLN Apply to eye.    . hydroxychloroquine (PLAQUENIL) 200 MG tablet Take 200 mg by mouth 2 (two) times daily. Sulfate  5  . latanoprost (XALATAN) 0.005 % ophthalmic solution 1 drop at bedtime.    . naproxen (NAPROSYN) 500 MG tablet TAKE ONE TABLET BY MOUTH TWICE DAILY WITH MEAL  5  . omeprazole (PRILOSEC) 40 MG capsule Take 40 mg by mouth daily.  5  . tamsulosin (FLOMAX) 0.4 MG CAPS capsule TAKE 1 CAPSULE (0.4 MG TOTAL) BY MOUTH ONCE DAILY. TAKE 30 MINUTES AFTER SAME MEAL EACH DAY.  0  . traMADol (ULTRAM) 50 MG tablet Take 50 mg by mouth 2 (two) times daily as needed. for pain  1   No current facility-administered medications for this visit.    REVIEW OF SYSTEMS:  [X]  denotes positive finding, [ ]  denotes negative finding Cardiac  Comments:  Chest pain or chest pressure:    Shortness of breath upon exertion:    Short of breath when lying flat:    Irregular heart rhythm:        Vascular    Pain in calf, thigh, or hip brought on by ambulation:    Pain  in feet at night that wakes you up from your sleep:     Blood clot in your veins:    Leg swelling:         Pulmonary    Oxygen at home:    Productive cough:     Wheezing:         Neurologic    Sudden weakness in arms or legs:     Sudden numbness in arms or legs:     Sudden onset of difficulty speaking or slurred speech:    Temporary loss of vision in one eye:     Problems with dizziness:         Gastrointestinal    Blood in stool:     Vomited blood:         Genitourinary    Burning when urinating:     Blood in urine:        Psychiatric    Major depression:         Hematologic    Bleeding problems:    Problems with blood clotting too easily:        Skin    Rashes or ulcers:        Constitutional    Fever or chills:      PHYSICAL EXAM: There were no vitals filed for this visit.  GENERAL: The  patient is a well-nourished male, in no acute distress. The vital signs are documented above. CARDIAC: There is a regular rate and rhythm.  VASCULAR: I do not detect carotid bruits. He has palpable femoral pulses and palpable pedal pulses bilaterally. He has no significant lower extremity swelling. PULMONARY: There is good air exchange bilaterally without wheezing or rales. ABDOMEN: Soft and non-tender with normal pitched bowel sounds.  MUSCULOSKELETAL: There are no major deformities or cyanosis. NEUROLOGIC: No focal weakness or paresthesias are detected. SKIN: There are no ulcers or rashes noted. PSYCHIATRIC: The patient has a normal affect.  DATA:  I have reviewed his MRI. I do not see any calcific disease in the iliac vessels. He has a moderate disc bulge at L5-S1.  MEDICAL ISSUES:  DEGENERATIVE DISC DISEASE L5-S1: He appears to be a good candidate for anterior rectoperineal exposure of L5-S1. I have reviewed our role in exposure of the spine in order to allow anterior lumbar interbody fusion at the appropriate levels. We have discussed the potential complications of surgery, including but not limited to, arterial or venous injury, thrombosis, or bleeding. We have also discussed the potential risks of wound healing problems, the development of a hernia, nerve injury, leg swelling, or other unpredictable medical problems. I've also explained that for the L5-S1 level there is a small risk of retrograde ejaculation. All the patient's questions were answered and they are agreeable to proceed.  [name] [facility] [place]: [phone]

## 2014-11-09 NOTE — Brief Op Note (Signed)
11/09/2014  11:01 AM  PATIENT:  Nicholas Vazquez.  59 y.o. male  PRE-OPERATIVE DIAGNOSIS:  DDD L5 - S1 with stenosis  POST-OPERATIVE DIAGNOSIS:  DDD L5 - S1 with stenosis  PROCEDURE:  Procedure(s): ANTERIOR LUMBAR FUSION 1 LEVEL L5 - S1 (N/A) ABDOMINAL EXPOSURE (N/A)  SURGEON:  Surgeon(s) and Role: Panel 1:    * Melina Schools, MD - Primary  Panel 2:    * Angelia Mould, MD - Primary  PHYSICIAN ASSISTANT:   ASSISTANTS: Carmen Mayo   ANESTHESIA:   general  EBL:  Total I/O In: 2250 [I.V.:2000; IV Piggyback:250] Out: 515 [Urine:265; Blood:250]  BLOOD ADMINISTERED:none  DRAINS: none   LOCAL MEDICATIONS USED:  MARCAINE     SPECIMEN:  No Specimen  DISPOSITION OF SPECIMEN:  N/A  COUNTS:  YES  TOURNIQUET:  * No tourniquets in log *  DICTATION: .Other Dictation: Dictation Number [id number]  PLAN OF CARE: Admit to inpatient   PATIENT DISPOSITION:  PACU - hemodynamically stable.

## 2014-11-09 NOTE — Op Note (Signed)
    NAME: Nicholas Vazquez.   MRN: [id number] DOB: [birth year]    DATE OF OPERATION: 11/09/2014  PREOP DIAGNOSIS: Degenerative disc disease L5-S1  POSTOP DIAGNOSIS: Same  PROCEDURE: Anterior retroperitoneal exposure of L5-S1  EXPOSURE SURGEON: Judeth Cornfield. Scot Dock, MD  SPINE SURGEON: Melina Schools. MD  ASSIST: Asencion Partridge, PA  ANESTHESIA: Gen.   EBL: Minimal   INDICATIONS: [name]. is a 59 y.o. male who presented with significant back pain and had failed conservative treatment. Anterior lumbar interbody fusion at L5-S1 was recommended. I was asked to provide anterior retroperitoneal exposure.  FINDINGS: Significant degenerative disc disease L5-S1.  TECHNIQUE: The patient was taken to the operating room and received a general anesthetic. Monitoring lines were placed. The level of the L5-S1 disc was marked under fluoroscopy. The abdomen was prepped and draped in the usual sterile fashion. A transverse incision was made to the left of the midline at the marked level. Dissection was carried down to the anterior rectus sheath which was divided transversely. The rectus abdominis muscle was mobilized medially and laterally in the anterior rectus sheath mobilized superiorly and inferiorly. Initially the rectus was retracted medially in the right peritoneal space was entered. The dissection was carried medial to the hypogastric and common iliac artery to the sacral promontory. This was done using blunt dissection. The middle sacral vessels were divided between clips and divided. Using blunt dissection was able to mobilize to the right of the L5-S1 disc space and the reverse lip retractor was fashioned using the Thompson retractor. To the left of the L5-S1 disc space and mobilizing the iliac vein laterally a small branch tore and this required a 5-0 Prolene suture repair stitch. A small lip was created in the vertebral body to position the reverse lip retractor on the left. This provided  adequate exposure of L5-S1. Position was confirmed under fluoroscopy. The remainder of the dictation is as per Dr. [name].

## 2014-11-09 NOTE — Interval H&P Note (Signed)
History and Physical Interval Note:  11/09/2014 7:25 AM  Nicholas Vazquez.  has presented today for surgery, with the diagnosis of DDD L5 - S1 with stenosis  The various methods of treatment have been discussed with the patient and family. After consideration of risks, benefits and other options for treatment, the patient has consented to  Procedure(s): ANTERIOR LUMBAR FUSION 1 LEVEL L5 - S1 (N/A) ABDOMINAL EXPOSURE (N/A) as a surgical intervention .  The patient's history has been reviewed, patient examined, no change in status, stable for surgery.  I have reviewed the patient's chart and labs.  Questions were answered to the patient's satisfaction.     [name]

## 2014-11-09 NOTE — Anesthesia Preprocedure Evaluation (Addendum)
Anesthesia Evaluation  Patient identified by MRN, date of birth, ID band Patient awake    Reviewed: Allergy & Precautions, NPO status , Patient's Chart, lab work & pertinent test results  History of Anesthesia Complications Negative for: history of anesthetic complications  Airway Mallampati: II  TM Distance: >3 FB Neck ROM: Full    Dental  (+) Teeth Intact, Dental Advisory Given   Pulmonary neg shortness of breath, neg sleep apnea, neg COPD, neg recent URI, former smoker,    breath sounds clear to auscultation       Cardiovascular negative cardio ROS   Rhythm:Regular     Neuro/Psych Glaucoma on drops  Neuromuscular disease negative psych ROS   GI/Hepatic Neg liver ROS, GERD  Medicated and Controlled,  Endo/Other  diabetes, Well Controlled, Oral Hypoglycemic AgentsLupus  Renal/GU negative Renal ROS     Musculoskeletal   Abdominal   Peds  Hematology negative hematology ROS (+)   Anesthesia Other Findings   Reproductive/Obstetrics                            Anesthesia Physical Anesthesia Plan  ASA: II  Anesthesia Plan: General   Post-op Pain Management:    Induction: Intravenous  Airway Management Planned: Oral ETT  Additional Equipment: None  Intra-op Plan:   Post-operative Plan: Extubation in OR  Informed Consent: I have reviewed the patients History and Physical, chart, labs and discussed the procedure including the risks, benefits and alternatives for the proposed anesthesia with the patient or authorized representative who has indicated his/her understanding and acceptance.   Dental advisory given  Plan Discussed with: CRNA and Surgeon  Anesthesia Plan Comments:         Anesthesia Quick Evaluation

## 2014-11-09 NOTE — Transfer of Care (Signed)
Immediate Anesthesia Transfer of Care Note  Patient: Nicholas Vazquez.  Procedure(s) Performed: Procedure(s): ANTERIOR LUMBAR FUSION 1 LEVEL L5 - S1 (N/A) ABDOMINAL EXPOSURE (N/A)  Patient Location: PACU  Anesthesia Type:General  Level of Consciousness: awake, alert , oriented and patient cooperative  Airway & Oxygen Therapy: Patient Spontanous Breathing and Patient connected to nasal cannula oxygen  Post-op Assessment: Report given to RN, Post -op Vital signs reviewed and stable and Patient moving all extremities X 4  Post vital signs: Reviewed and stable  Last Vitals:  Filed Vitals:   11/09/14 0605  BP: 135/87  Pulse: 80  Temp: 36.5 C  Resp: 20    Complications: No apparent anesthesia complications

## 2014-11-09 NOTE — Progress Notes (Signed)
Notified Dr [name] of Blood Sugar of 156 as per order

## 2014-11-09 NOTE — Anesthesia Procedure Notes (Signed)
Procedure Name: Intubation Date/Time: 11/09/2014 7:49 AM Performed by: Williemae Area B Pre-anesthesia Checklist: Patient identified, Suction available, Emergency Drugs available and Patient being monitored Patient Re-evaluated:Patient Re-evaluated prior to inductionOxygen Delivery Method: Circle system utilized Preoxygenation: Pre-oxygenation with 100% oxygen Intubation Type: IV induction Ventilation: Mask ventilation without difficulty and Oral airway inserted - appropriate to patient size Laryngoscope Size: Mac and 4 Grade View: Grade III Tube type: Oral Tube size: 7.5 mm Number of attempts: 2 (KBJ unable to expose any view of cords, deep and slightly forward;  Dr. Ermalene Postin accomplished intubation) Airway Equipment and Method: Stylet Placement Confirmation: positive ETCO2,  ETT inserted through vocal cords under direct vision and breath sounds checked- equal and bilateral (slightly blind view) Secured at: 23 (cm at teeth) cm Tube secured with: Tape Dental Injury: Dental damage  Comments: Powdery residue of incisor rubbed?

## 2014-11-09 NOTE — H&P (Signed)
History of Present Illness The patient is a 59 year old male who comes in today for a preoperative History and Physical. The patient is scheduled for a ALIF L5-S1 to be performed by Dr. Duane Lope D. [name], MD at [facility] on 11-09-14 . Please see the hospital record for complete dictated history and physical. The pt continues to have severe LBp and RLP. the pt has Lupus and RA. The pt had a recent prostate biopsy and will not have the results until 11/1.  Allergies  Sulfabenzamide *CHEMICALS*  Family History  Cancer Father. Diabetes Mellitus Mother.  Social History Former drinker 05/26/2014: In the past drank beer only occasionally per week Exercise Exercises rarely; does running / walking Current work status working full time Children 3 Tobacco use Former smoker. 05/26/2014: smoke(d) less than 1/2 pack(s) per day Tobacco / smoke exposure 05/26/2014: no Number of flights of stairs before winded greater than 5 Under pain contract Marital status married Living situation live with spouse No history of drug/alcohol rehab  Medication History  Omeprazole (40MG  Capsule DR, Oral) Active. (qd) Naproxen (500MG  Tablet, Oral) Active. (bid) Cyclobenzaprine HCl (5MG  Tablet, Oral) Active. (prn Rx'd at the [facility]) Hydroxychloroquine Sulfate (200MG  Tablet, Oral) Active. (bid) Latanoprost (0.005% Solution, Ophthalmic) Active. (qd) Brimonidine Tartrate (0.2% Solution, Ophthalmic) Active. (bid) Dorzolamide HCl-Timolol Mal (22.3-6.8MG /ML Solution, Ophthalmic) Active. (bid) Medications Reconciled  Past Surgical History Foot Surgery bilateral  Other Problems  Kidney Stone Other disease, cancer, significant illness Diabetes Mellitus, Type II Rheumatoid Arthritis Lupus Erythematosus, Systemic GLYCOMA  Vitals  11/03/2014 8:10 AM Weight: 196 lb Height: 71in Body Surface Area: 2.09 m Body Mass Index: 27.34 kg/m  Temp.: 98.62F(Oral)  Pulse: 81  (Regular)  BP: 146/99 (Sitting, Left Arm, Standard)  Physical Exam General General Appearance-Not in acute distress. Orientation-Oriented X3. Build & Nutrition-Well nourished and Well developed.  Integumentary General Characteristics Surgical Scars - no surgical scar evidence of previous lumbar surgery. Lumbar Spine-Skin examination of the lumbar spine is without deformity, skin lesions, lacerations or abrasions.  Chest and Lung Exam Auscultation Breath sounds - Normal and Clear.  Cardiovascular Auscultation Rhythm - Regular rate and rhythm.  Abdomen Palpation/Percussion Palpation and Percussion of the abdomen reveal - Soft, Non Tender and No Rebound tenderness.  Peripheral Vascular Lower Extremity Palpation - Posterior tibial pulse - Bilateral - 2+. Dorsalis pedis pulse - Bilateral - 2+.  Neurologic Sensation Lower Extremity - Left - sensation is intact in the lower extremity. Right - sensation is diminished in the lower extremity. Reflexes Patellar Reflex - Bilateral - 2+. Achilles Reflex - Bilateral - 2+. Clonus - Bilateral - clonus not present. Hoffman's Sign - Bilateral - Hoffman's sign not present. Testing Seated Straight Leg Raise - Right - Seated straight leg raise positive.  Musculoskeletal Spine/Ribs/Pelvis  Lumbosacral Spine: Inspection and Palpation - Tenderness - left lumbar paraspinals tender to palpation and right lumbar paraspinals tender to palpation. Strength and Tone: Strength - Hip Flexion - Bilateral - 5/5. Knee Extension - Bilateral - 5/5. Knee Flexion - Bilateral - 5/5. Ankle Dorsiflexion - Bilateral - 5/5. Ankle Plantarflexion - Bilateral - 5/5. Heel walk - Bilateral - able to heel walk with mild difficulty. Toe Walk - Bilateral - able to walk on toes with mild difficulty. Heel-Toe Walk - Bilateral - able to heel-toe walk without difficulty. ROM - Flexion - moderately decreased range of motion and painful. Extension - moderately decreased  range of motion and painful. Left Lateral Bending - moderately decreased range of motion and painful.  Right Lateral Bending - moderately decreased range of motion and painful. Right Rotation - moderately decreased range of motion and painful. Left Rotation - moderately decreased range of motion and painful. Pain - flexion is more painful than extension. Lumbosacral Spine - Waddell's Signs - no Waddell's signs present. Lower Extremity Range of Motion - No true hip, knee or ankle pain with range of motion. Gait and Station - [employer] - no assistive devices.  Anterior lumbar fusion: Risks of surgery include, but are not limited to: Death, stroke, paralysis, nerve root damage/injury, bleeding, blood clots, loss of bowel/bladder control, sexual dysfunction, retrograde ejaculation, hardware failure, or mal-position, spinal fluid leak, adjacent segment disease, non-union, need for further surgery, ongoing or worse pain, injury to bladder, bowel and abdominal contents, infection. Need for supplemental posterior surgery including decompression and need for screw fixation. Goal Of Surgery:Discussed that goal of surgery is to reduce pain and improve function and quality of life. Patient is aware that despite all appropriate treatment that there pain and function could be the same, worse, or different.  Plans Transcription  At this point in time, having tried physical therapy, injection therapy, activity modification, medications, he continues to have debilitating pain. His injury occurred on 04/25/2014, we are now five months out from his work related injury and he has had no significant improvement. At this point in time, given the failure of conservative management, the single level pathology at L5-S1 on both MRI and plain x-ray and the increasing neuropathic leg pain that he has been dealing, I think it is reasonable to proceed with a fusion surgery. It is clear that he has failed conservative management.  The goal of surgery is reduction in pain, improvement in quality of life and to give him an opportunity to return to gainful full time employment. We have reviewed all of the risks of surgery, which would be an anterior lumbar interbody fusion. We reviewed the procedure itself and I have given him some literature to review online. All of his questions were addressed. We will plan on proceeding with surgery once we have preoperative medical clearance. I have asked him to speak with his PCP also about his prostate issues which I think is the source of his urinary problems. I would recommend restarting his Flomax prior to surgery to give him a better overall postoperative course with less likely chance of retention. I will forward a copy of this to his primary care physician, Dr. Frazier Richards.

## 2014-11-10 ENCOUNTER — Inpatient Hospital Stay (HOSPITAL_COMMUNITY): Payer: Worker's Compensation

## 2014-11-10 ENCOUNTER — Encounter (HOSPITAL_COMMUNITY): Payer: Self-pay | Admitting: Orthopedic Surgery

## 2014-11-10 DIAGNOSIS — Z9889 Other specified postprocedural states: Secondary | ICD-10-CM

## 2014-11-10 LAB — GLUCOSE, CAPILLARY
Glucose-Capillary: 126 mg/dL — ABNORMAL HIGH (ref 65–99)
Glucose-Capillary: 179 mg/dL — ABNORMAL HIGH (ref 65–99)
Glucose-Capillary: 161 mg/dL — ABNORMAL HIGH (ref 65–99)

## 2014-11-10 MED ORDER — BRIMONIDINE TARTRATE 0.2 % OP SOLN
1.0000 [drp] | Freq: Two times a day (BID) | OPHTHALMIC | Status: DC
  Administered 2014-11-10 – 2014-11-12 (×5): 1 [drp] via OPHTHALMIC

## 2014-11-10 MED ORDER — DORZOLAMIDE HCL-TIMOLOL MAL 2-0.5 % OP SOLN
1.0000 [drp] | Freq: Two times a day (BID) | OPHTHALMIC | Status: DC
  Administered 2014-11-10 – 2014-11-12 (×5): 1 [drp] via OPHTHALMIC

## 2014-11-10 NOTE — Anesthesia Postprocedure Evaluation (Signed)
  Anesthesia Post-op Note  Patient: Nicholas Vazquez.  Procedure(s) Performed: Procedure(s): ANTERIOR LUMBAR FUSION 1 LEVEL L5 - S1 (N/A) ABDOMINAL EXPOSURE (N/A)  Patient Location: PACU  Anesthesia Type:General  Level of Consciousness: awake  Airway and Oxygen Therapy: Patient Spontanous Breathing  Post-op Pain: mild  Post-op Assessment: Post-op Vital signs reviewed, Patient's Cardiovascular Status Stable, Respiratory Function Stable, Patent Airway, No signs of Nausea or vomiting and Pain level controlled              Post-op Vital Signs: Reviewed and stable  Last Vitals:  Filed Vitals:   11/10/14 0602  BP: 132/80  Pulse: 75  Temp: 36.9 C  Resp: 16    Complications: No apparent anesthesia complications

## 2014-11-10 NOTE — Progress Notes (Signed)
*  Preliminary Results* Bilateral lower extremity venous duplex completed. Bilateral lower extremities are negative for deep vein thrombosis. There is no evidence of Baker's cyst bilaterally.  11/10/2014  Shacarra Choe, RVT, RDCS, RDMS  

## 2014-11-10 NOTE — Progress Notes (Signed)
    Subjective: Procedure(s) (LRB): ANTERIOR LUMBAR FUSION 1 LEVEL L5 - S1 (N/A) ABDOMINAL EXPOSURE (N/A) 1 Day Post-Op  Patient reports pain as 3 on 0-10 scale.  Reports decreased leg pain reports incisional back pain   N/A void - foley just removed Negative bowel movement Positive flatus Negative chest pain or shortness of breath  Objective: Vital signs in last 24 hours: Temp:  [97.3 F (36.3 C)-98.8 F (37.1 C)] 98.5 F (36.9 C) (11/04 0602) Pulse Rate:  [72-88] 75 (11/04 0602) Resp:  [10-17] 16 (11/04 0602) BP: (132-162)/(80-96) 132/80 mmHg (11/04 0602) SpO2:  [98 %-100 %] 98 % (11/04 0602)  Intake/Output from previous day: 11/03 0701 - 11/04 0700 In: 4018.8 [P.O.:620; I.V.:3098.8; IV Piggyback:300] Out: 8563 [Urine:3565; Blood:250]  Labs: No results for input(s): WBC, RBC, HCT, PLT in the last 72 hours. No results for input(s): NA, K, CL, CO2, BUN, CREATININE, GLUCOSE, CALCIUM in the last 72 hours. No results for input(s): LABPT, INR in the last 72 hours.  Physical Exam: Neurologically intact ABD soft Intact pulses distally Incision: dressing C/D/I Compartment soft  Assessment/Plan: Patient stable  xrays satisfactory - improved L5/S1 disc height Continue mobilization with physical therapy Continue care  Advance diet Up with therapy Plan for discharge tomorrow Discharge home with home health  Melina Schools, MD [place] [phone]

## 2014-11-10 NOTE — Progress Notes (Signed)
Physical Therapy Treatment Patient Details Name: Marvens Hollars. MRN: [id number] DOB: [birth year] Today's Date: 11/10/2014    History of Present Illness 59 y.o. M admitted 11/09/2014 for Anterior Lumbar Fusion (1 Level), L5-S1, Abdominal Exposure. PMH: lupus, cancer, diabetes.     PT Comments    Patient making steady progress with PT sessions. Patient able to ambulate 400 feet with rw and supervision. Min assist needed with sit to supine using log roll technique. Verbal and manual cues needed for correct technique. Will continue to progress mobility as tolerated. Anticipate D/C to home with family support.   Follow Up Recommendations  Home health PT;Supervision for mobility/OOB     Equipment Recommendations  None recommended by PT    Recommendations for Other Services       Precautions / Restrictions  Precautions Precautions: Back Required Braces or Orthoses: Spinal Brace Spinal Brace: Applied in sitting position Restrictions Weight Bearing Restrictions: No   Mobility  Bed Mobility Overal bed mobility: Needs Assistance Bed Mobility: Sit to Supine Rolling: Supervision   Sit to supine: Min assist   General bed mobility comments: review of logroll technique, assist with LEs.   Transfers Overall transfer level: Needs assistance Equipment used: Rolling walker (2 wheeled) Transfers: Sit to/from Stand Sit to Stand: Min guard         General transfer comment: Brace donned prior to standing; pt able to don brace independently  Ambulation/Gait Ambulation/Gait assistance: Supervision Ambulation Distance (Feet): 400 Feet Assistive device: Rolling walker (2 wheeled) Gait Pattern/deviations: Step-through pattern Gait velocity: slightly decreased   General Gait Details: cues for posture during ambulation   Stairs            Wheelchair Mobility    Modified Rankin (Stroke Patients Only)       Balance Overall balance assessment: Needs  assistance Sitting-balance support: No upper extremity supported Sitting balance-Leahy Scale: Good     Standing balance support: During functional activity Standing balance-Leahy Scale: Fair Standing balance comment: using rw                    Cognition Arousal/Alertness: Awake/alert Behavior During Therapy: WFL for tasks assessed/performed Overall Cognitive Status: Within Functional Limits for tasks assessed                      Exercises      General Comments        Pertinent Vitals/Pain Pain Assessment: 0-10 Pain Score: 4  Pain Location: abdomen Pain Descriptors / Indicators: Sore Pain Intervention(s): Limited activity within patient's tolerance;Monitored during session    Home Living       Prior Function          PT Goals (current goals can now be found in the care plan section) Acute Rehab PT Goals Patient Stated Goal: to go home PT Goal Formulation: With patient Time For Goal Achievement: 11/24/14 Potential to Achieve Goals: Good Progress towards PT goals: Progressing toward goals    Frequency  Min 5X/week    PT Plan Current plan remains appropriate    Co-evaluation             End of Session Equipment Utilized During Treatment: Back brace;Gait belt Activity Tolerance: Patient tolerated treatment well Patient left: in bed;with call bell/phone within reach     Time: 1512-1529 PT Time Calculation (min) (ACUTE ONLY): 17 min  Charges:  $Gait Training: 8-22 mins  G Codes:      Cassell Clement, PT, CSCS Pager [phone] Office [phone]  11/10/2014, 3:58 PM

## 2014-11-10 NOTE — Progress Notes (Addendum)
09:42:LM with Amparo Bristol, RN CM Workmans Comp  re setup HHPT in anticipation of Discharge 11/11/2014. 09:15 Nicholas Vazquez has been working with Amparo Bristol, RNCM at  [facility]  Phone: [phone]. 0848:Utilization Review Completed on this 59 y.o Male who had  Anterior Retroperitoneal Exposure L5-S1 11/09/2014. Suffered fall at work in April of this year,  with  failure of conservative therapies. Lives in private residence with Spouse where he will return with HHPT.Has Brace which he will Apply/Remove while Sitting.

## 2014-11-10 NOTE — Evaluation (Signed)
Occupational Therapy Evaluation Patient Details Name: Nicholas Vazquez. MRN: [id number] DOB: 05-27-1955 Today's Date: 11/10/2014    History of Present Illness 59 y.o. M admitted 11/09/2014 for Anterior Lumbar Fusion (1 Level), L5-S1, Abdominal Exposure. PMH: lupus, cancer, diabetes.    Clinical Impression   Pt reports he was independent with ADLs PTA. Currently pt is overall min guard for ADLs and functional mobility with the exception of min A for LB ADLs. Began back precaution and ADL education with pt; pt verbalized understanding. Pt planning to d/c home with 24/7 supervision from family and friends. Pt would benefit from continued skilled OT in order to maximize independence and safety with LB ADLs using AE and tub transfers using a 3 in 1.     Follow Up Recommendations  No OT follow up;Supervision - Intermittent    Equipment Recommendations  3 in 1 bedside comode;Other (comment) (AE kit: reacher, sock aide, lh shoe horn, lh sponge)    Recommendations for Other Services       Precautions / Restrictions Precautions Precautions: Back Precaution Comments: Pt able to recall 2/3 back precautions. Reviewed precautions with pt Required Braces or Orthoses: Spinal Brace Spinal Brace: Applied in sitting position Restrictions Weight Bearing Restrictions: No      Mobility Bed Mobility Overal bed mobility: Needs Assistance Bed Mobility: Rolling;Sidelying to Sit Rolling: Supervision Sidelying to sit: Min guard       General bed mobility comments: Good technique. No physical assist required  Transfers Overall transfer level: Needs assistance Equipment used: Rolling walker (2 wheeled) Transfers: Sit to/from Stand Sit to Stand: Min guard         General transfer comment: Brace donned prior to standing; pt able to don brace independently    Balance Overall balance assessment: Needs assistance Sitting-balance support: No upper extremity supported Sitting balance-Leahy  Scale: Good     Standing balance support: Bilateral upper extremity supported Standing balance-Leahy Scale: Fair Standing balance comment: RW for support                            ADL Overall ADL's : Needs assistance/impaired Eating/Feeding: Set up;Sitting   Grooming: Min guard;Standing       Lower Body Bathing: Minimal assistance;Sit to/from stand       Lower Body Dressing: Minimal assistance;Sit to/from stand Lower Body Dressing Details (indicate cue type and reason): Began education on use of AE for increased independence with LB ADLs; pt verbalized understanding and interested in using AE Toilet Transfer: Min guard;Ambulation;BSC;RW (BSC over toilet)           Functional mobility during ADLs: Min guard;Rolling walker General ADL Comments: No family present for OT eval. Began education on compensatory strategies for ADL, use of 3 in 1 over toilet and in tub, keeping frequently used items at counter top height to maintain precautions, maintaining precautions during functional activities; pt verbalized understanding. Educated pt on sitting up in chair for only 45 minutes to an hour at a time; pt verbalized understanding.      Vision     Perception     Praxis      Pertinent Vitals/Pain Pain Assessment: 0-10 Pain Score: 4  Pain Location: L abdomen Pain Descriptors / Indicators: Aching;Sore Pain Intervention(s): Limited activity within patient's tolerance;Monitored during session;Repositioned     Hand Dominance     Extremity/Trunk Assessment Upper Extremity Assessment Upper Extremity Assessment: Overall WFL for tasks assessed   Lower Extremity Assessment Lower  Extremity Assessment: Overall WFL for tasks assessed   Cervical / Trunk Assessment Cervical / Trunk Assessment: Normal   Communication Communication Communication: No difficulties   Cognition Arousal/Alertness: Awake/alert Behavior During Therapy: WFL for tasks assessed/performed Overall  Cognitive Status: Within Functional Limits for tasks assessed                     General Comments       Exercises       Shoulder Instructions      Home Living Family/patient expects to be discharged to:: Private residence Living Arrangements: Spouse/significant other Available Help at Discharge: Family;Friend(s);Available 24 hours/day Type of Home: House Home Access: Stairs to enter [employer] of Steps: 5 Entrance Stairs-Rails: Right Home Layout: One level     Bathroom Shower/Tub: Tub/shower unit Shower/tub characteristics: Architectural technologist: Standard Bathroom Accessibility: Yes How Accessible: Accessible via walker Home Equipment: [place] - 2 wheels          Prior Functioning/Environment Level of Independence: Independent (occasional use of walker when getting out of bed)             OT Diagnosis: Acute pain   OT Problem List: Impaired balance (sitting and/or standing);Decreased knowledge of use of DME or AE;Decreased knowledge of precautions;Pain   OT Treatment/Interventions: Self-care/ADL training;DME and/or AE instruction;Patient/family education    OT Goals(Current goals can be found in the care plan section) Acute Rehab OT Goals Patient Stated Goal: to go home OT Goal Formulation: With patient Time For Goal Achievement: 11/24/14 Potential to Achieve Goals: Good ADL Goals Pt Will Perform Lower Body Bathing: with adaptive equipment;sit to/from stand;with supervision Pt Will Perform Lower Body Dressing: with supervision;with adaptive equipment;sit to/from stand Pt Will Perform Toileting - Clothing Manipulation and hygiene: with supervision;sit to/from stand (with or without AE) Pt Will Perform Tub/Shower Transfer: Tub transfer;with supervision;ambulating;3 in 1;rolling walker  OT Frequency: Min 2X/week   Barriers to D/C:            Co-evaluation              End of Session Equipment Utilized During Treatment: Gait  belt;Rolling walker;Back brace  Activity Tolerance: Patient tolerated treatment well Patient left: in chair;with call bell/phone within reach   Time: 5488-3014 OT Time Calculation (min): 24 min Charges:  OT General Charges $OT Visit: 1 Procedure OT Evaluation $Initial OT Evaluation Tier I: 1 Procedure OT Treatments $Self Care/Home Management : 8-22 mins G-Codes:     Binnie Kand M.S., OTR/L Pager: [phone]  11/10/2014, 2:41 PM

## 2014-11-10 NOTE — Evaluation (Signed)
Physical Therapy Evaluation Patient Details Name: Nicholas Vazquez. MRN: [id number] DOB: March 13, 1955 Today's Date: 11/10/2014   History of Present Illness  59 y.o. M admitted 11/09/2014 for Anterior Lumbar Fusion (1 Level), L5-S1, Abdominal Exposure. PMH: lupus, cancer, diabetes.   Clinical Impression  Patient is s/p above surgery resulting in the deficits listed below (see PT Problem List).  Patient will benefit from skilled PT to increase their independence and safety with mobility (while adhering to their precautions) to allow discharge to home with family assistance. Able to ambulate 150 feet with rw and supervision during initial PT session.      Follow Up Recommendations Home health PT;Supervision for mobility/OOB    Equipment Recommendations  None recommended by PT;Other (comment) (has rw at home)    Recommendations for Other Services       Precautions / Restrictions Precautions Precautions: Back Required Braces or Orthoses: Spinal Brace Spinal Brace: Applied in sitting position Restrictions Weight Bearing Restrictions: No      Mobility  Bed Mobility Overal bed mobility: Needs Assistance Bed Mobility: Rolling;Sidelying to Sit Rolling: Supervision Sidelying to sit: Min guard (cues for technique with logroll)       General bed mobility comments: cues for logroll technique.   Transfers Overall transfer level: Needs assistance Equipment used: Rolling walker (2 wheeled) Transfers: Sit to/from Stand Sit to Stand: Min guard         General transfer comment: reminder for hand position. Brace donned prior to standing  Ambulation/Gait Ambulation/Gait assistance: Supervision Ambulation Distance (Feet): 150 Feet Assistive device: Rolling walker (2 wheeled) Gait Pattern/deviations: Step-through pattern Gait velocity: decreased   General Gait Details: steady pattern, no loss of balance.   Stairs            Wheelchair Mobility    Modified Rankin  (Stroke Patients Only)       Balance Overall balance assessment: Needs assistance Sitting-balance support: No upper extremity supported Sitting balance-Leahy Scale: Good     Standing balance support: During functional activity Standing balance-Leahy Scale: Fair Standing balance comment: using rw                             Pertinent Vitals/Pain Pain Assessment: 0-10 Pain Score: 5  Pain Location: abdomen Pain Descriptors / Indicators: Sore Pain Intervention(s): Limited activity within patient's tolerance;Monitored during session    Home Living Family/patient expects to be discharged to:: Private residence Living Arrangements: Spouse/significant other Available Help at Discharge: Family Type of Home: House Home Access: Stairs to enter Entrance Stairs-Rails: Right Entrance Stairs-Number of Steps: 5 Home Layout: One level Home Equipment: Environmental consultant - 2 wheels      Prior Function Level of Independence: Independent (occasional use of walker when getting out of bed)               Hand Dominance        Extremity/Trunk Assessment   Upper Extremity Assessment: Defer to OT evaluation           Lower Extremity Assessment: Overall WFL for tasks assessed         Communication   Communication: No difficulties  Cognition Arousal/Alertness: Awake/alert Behavior During Therapy: WFL for tasks assessed/performed Overall Cognitive Status: Within Functional Limits for tasks assessed                      General Comments General comments (skin integrity, edema, etc.): patient able to don brace with supervision  in sitting.     Exercises        Assessment/Plan    PT Assessment Patient needs continued PT services  PT Diagnosis Difficulty walking;Acute pain   PT Problem List Decreased strength;Decreased range of motion;Decreased activity tolerance;Decreased balance;Decreased mobility;Pain  PT Treatment Interventions DME instruction;Gait  training;Stair training;Functional mobility training;Therapeutic activities;Therapeutic exercise;Patient/family education   PT Goals (Current goals can be found in the Care Plan section) Acute Rehab PT Goals Patient Stated Goal: go home PT Goal Formulation: With patient Time For Goal Achievement: 11/24/14 Potential to Achieve Goals: Good    Frequency Min 5X/week   Barriers to discharge        Co-evaluation               End of Session Equipment Utilized During Treatment: Back brace;Gait belt Activity Tolerance: Patient tolerated treatment well Patient left: in chair;with call bell/phone within reach Nurse Communication: Mobility status;Precautions         Time: 7782-4235 PT Time Calculation (min) (ACUTE ONLY): 27 min   Charges:     PT Treatments $Gait Training: 8-22 mins   PT G Codes:        Cassell Clement, PT, CSCS Pager [phone] Office [phone]  11/10/2014, 10:55 AM

## 2014-11-10 NOTE — Care Management Note (Signed)
Case Management Note  Patient Details  Name: Rodderick Holtzer. MRN: [id number] Date of Birth: 02-19-1955  Subjective/Objective:    59 y.o. M admitted 11/09/2014 for Anterior Lumbar Fusion (1 Level), L5-S1, Abdominal Exposure. Lives in [place] in [place] with his spouse where he plans to return. PT recommends HHPT. Does not need DME, reports he has walker at home.  Pt is wearing Brace which he verbalizes understanding that he will Don ON/OFF while SITTING.                Action/Plan: Will contact Amparo Bristol, RN CM [phone] with [place] Dept (have LM for return Call) to arrange [place].    Expected Discharge Date:                  Expected Discharge Plan:  [place]  In-House Referral:     Discharge planning Services  CM Consult  Post Acute Care Choice:    Choice offered to:   (pt is Workmans Comp)  DME Arranged:    DME Agency:     HH Arranged:    [place] Agency:     Status of Service:  In process, will continue to follow  Medicare Important Message Given:    Date Medicare IM Given:    Medicare IM give by:    Date Additional Medicare IM Given:    Additional Medicare Important Message give by:     If discussed at [place] of Stay Meetings, dates discussed:    Additional Comments:  Delrae Sawyers, RN 11/10/2014, 9:48 AM

## 2014-11-10 NOTE — Progress Notes (Signed)
   VASCULAR SURGERY ASSESSMENT & PLAN:  * 1 Day Post-Op s/p: Anterior retroperitoneal exposure of L5-S1  *  Doing well. Vascular will be available as needed.  SUBJECTIVE: Pain well controlled.  PHYSICAL EXAM: Filed Vitals:   11/09/14 1620 11/09/14 2128 11/10/14 0200 11/10/14 0602  BP: 153/82 154/87 149/83 132/80  Pulse: 74 81 77 75  Temp: 98.1 F (36.7 C) 97.3 F (36.3 C) 98.8 F (37.1 C) 98.5 F (36.9 C)  TempSrc:  Oral Oral   Resp: 16 16 17 16   Height:      Weight:      SpO2: 100% 100% 99% 98%   Dressing dry Palpable left dorsalis pedis pulse  LABS: Lab Results  Component Value Date   WBC 4.8 10/31/2014   HGB 12.8* 10/31/2014   HCT 37.3* 10/31/2014   MCV 91.0 10/31/2014   PLT 170 10/31/2014   Lab Results  Component Value Date   CREATININE 1.13 10/31/2014    Recent Labs  11/09/14 1639 11/09/14 2218 11/10/14 0630  GLUCAP 156* 120* 126*    Active Problems:   Back pain   Gae Gallop Beeper: [phone] 11/10/2014
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
## 2014-11-11 MED ORDER — BISACODYL 5 MG PO TBEC
5.0000 mg | DELAYED_RELEASE_TABLET | Freq: Every day | ORAL | Status: DC | PRN
  Administered 2014-11-11: 5 mg via ORAL
  Filled 2014-11-11: qty 1

## 2014-11-11 MED ORDER — DOCUSATE SODIUM 100 MG PO CAPS
100.0000 mg | ORAL_CAPSULE | Freq: Two times a day (BID) | ORAL | Status: DC
  Administered 2014-11-11 – 2014-11-12 (×2): 100 mg via ORAL
  Filled 2014-11-11 (×2): qty 1

## 2014-11-11 NOTE — Progress Notes (Signed)
Nicholas Vazquez.  MRN: [id number] DOB/Age: 06/26/1955 59 y.o. Physician: Ander Slade, M.D. 2 Days Post-Op Procedure(s) (LRB): ANTERIOR LUMBAR FUSION 1 LEVEL L5 - S1 (N/A) ABDOMINAL EXPOSURE (N/A)  Subjective: Reports minimal pain, good appetite, no BM yet Vital Signs Temp:  [98.1 F (36.7 C)-98.7 F (37.1 C)] 98.7 F (37.1 C) (11/05 0900) Pulse Rate:  [85-90] 88 (11/05 0900) Resp:  [17-20] 17 (11/05 0900) BP: (126-155)/(73-86) 126/76 mmHg (11/05 0900) SpO2:  [96 %-100 %] 99 % (11/05 0900)  Lab Results No results for input(s): WBC, HGB, HCT, PLT in the last 72 hours. BMET No results for input(s): NA, K, CL, CO2, GLUCOSE, BUN, CREATININE, CALCIUM in the last 72 hours. No results found for: INR   Exam  Sitting comfortably in bed eating lunch,  Grossly N/V intact BLE  Plan Completed PT/OT and cleared for D/C, will anticipate D/C home later this afternoon if has BM. F/u with Dr. [name] per post op plans  [facility] M Carmel Garfield 11/11/2014, 12:52 PM    Contact # [phone]

## 2014-11-11 NOTE — Progress Notes (Signed)
Occupational Therapy Treatment Patient Details Name: Nicholas Vazquez. MRN: [id number] DOB: 1955-02-24 Today's Date: 11/11/2014    History of present illness 59 y.o. M admitted 11/09/2014 for Anterior Lumbar Fusion (1 Level), L5-S1, Abdominal Exposure. PMH: lupus, cancer, diabetes.    OT comments  Pt. Making great gains with skilled OT reports pain is well managed.  Able to return demo of use of all A/E for LB dressing.  Return demo of tub transfer with 3n1.  Will continue to follow but note d/c likely later today.    Follow Up Recommendations  No OT follow up;Supervision - Intermittent    Equipment Recommendations  3 in 1 bedside comode;Other (comment)  Reacher, LH sponge, sock-aide, LH shoe horn   Recommendations for Other Services      Precautions / Restrictions Precautions Precautions: Back Precaution Comments: reviewed back precautions with pt., has most difficulty not twisting during functional tasks Required Braces or Orthoses: Spinal Brace Spinal Brace: Applied in sitting position       Mobility Bed Mobility Overal bed mobility: Needs Assistance Bed Mobility: Rolling;Sidelying to Sit Rolling: Supervision Sidelying to sit: Supervision       General bed mobility comments: hob flat, exits from left side, inst. cues for proper log roll technique but able to complete with verbal step by step inst.   Transfers Overall transfer level: Needs assistance Equipment used: Rolling walker (2 wheeled) Transfers: Sit to/from [facility] Sit to Stand: Supervision Stand pivot transfers: Supervision       General transfer comment: Brace donned prior to standing; pt able to don brace independently    Balance                                   ADL Overall ADL's : Needs assistance/impaired             Lower Body Bathing: With adaptive equipment Lower Body Bathing Details (indicate cue type and reason): reviewed use of LH spoinge for LB  bathing Upper Body Dressing : Set up;Sitting Upper Body Dressing Details (indicate cue type and reason): don and adjust brace  Lower Body Dressing: With adaptive equipment;Minimal assistance;Cueing for sequencing;Sitting/lateral leans   Toilet Transfer: Supervision/safety;Ambulation;RW Toilet Transfer Details (indicate cue type and reason): simulated during in room transfers and transfers in b.room  Toileting- Clothing Manipulation and Hygiene: Supervision/safety;Sit to/from stand Toileting - Clothing Manipulation Details (indicate cue type and reason): simulated during adl tasks in room Tub/ Shower Transfer: Tub transfer;Min guard;Adhering to back precautions;Cueing for sequencing;Ambulation;3 in 1 Tub/Shower Transfer Details (indicate cue type and reason): pt. able to side step to reported left side faucet with 3n1 in the tub Functional mobility during ADLs: Min guard General ADL Comments: pt. able to return demo of use of A/E for LB dressing, tub transfers with side step and use of 3n1, and bed mobility.  pt. able to verbalize step by step so he could guide wife during transfer (wife not present for session this am)      Vision                     Perception     Praxis      Cognition   Behavior During Therapy: [facility] for tasks assessed/performed Overall Cognitive Status: Within Functional Limits for tasks assessed  Extremity/Trunk Assessment               Exercises     Shoulder Instructions       General Comments      Pertinent Vitals/ Pain       Pain Assessment: No/denies pain Pain Intervention(s): Premedicated before session  Home Living                                          Prior Functioning/Environment              Frequency Min 2X/week     Progress Toward Goals  OT Goals(current goals can now be found in the care plan section)  Progress towards OT goals: Progressing toward goals     Plan  Discharge plan remains appropriate    Co-evaluation                 End of Session Equipment Utilized During Treatment: Gait belt;Rolling walker;Back brace   Activity Tolerance Patient tolerated treatment well   Patient Left in chair;with call bell/phone within reach   Nurse Communication          Time: 0700-0738 OT Time Calculation (min): 38 min  Charges: OT General Charges $OT Visit: 1 Procedure OT Treatments $Self Care/Home Management : 38-52 mins  Janice Coffin, COTA/L 11/11/2014, 7:44 AM

## 2014-11-11 NOTE — Progress Notes (Addendum)
Spoke with Nicholas Vazquez on 11/10/2014 @ 1200 and received Fax number( She will arrange all [facility] when she returns to home office. Will need copy of Discharge summary and orders which writer will fax at time of discharge. Will be available over weekend for any additional CM needs. Delrae Sawyers, [place]

## 2014-11-11 NOTE — Progress Notes (Cosign Needed)
Physical Therapy Treatment Patient Details Name: Nicholas Vazquez. MRN: [id number] DOB: 1955-04-26 Today's Date: 11/11/2014    History of Present Illness 59 y.o. M admitted 11/09/2014 for Anterior Lumbar Fusion (1 Level), L5-S1, Abdominal Exposure. PMH: lupus, cancer, diabetes.     PT Comments    Pt progressing well with mobility/PT goals.  Completed stair training this session & ambulated ~400' with supervision.  Patient safe to D/C from a mobility standpoint based on progression towards goals set on PT eval.    Follow Up Recommendations  Home health PT;Supervision for mobility/OOB     Equipment Recommendations  Rolling walker with 5" wheels;3in1 (PT)    Recommendations for Other Services       Precautions / Restrictions Precautions Precautions: Back Precaution Comments: pt able to recall 3/3 back precautions Required Braces or Orthoses: Spinal Brace Spinal Brace: Applied in sitting position    Mobility  Bed Mobility Overal bed mobility: Needs Assistance Bed Mobility: Rolling;Sidelying to Sit Rolling: Supervision Sidelying to sit: Supervision       General bed mobility comments: hob flat, exits from left side, inst. cues for proper log roll technique but able to complete with verbal step by step inst.   Transfers Overall transfer level: Needs assistance Equipment used: Rolling walker (2 wheeled) Transfers: Sit to/from Stand Sit to Stand: Modified independent (Device/Increase time) Stand pivot transfers: Supervision       General transfer comment: Brace donned prior to standing; pt able to don brace independently  Ambulation/Gait Ambulation/Gait assistance: Supervision Ambulation Distance (Feet): 400 Feet Assistive device: Rolling walker (2 wheeled) Gait Pattern/deviations: Step-through pattern Gait velocity: slightly decreased   General Gait Details: cues to relax shoulders but moving well   Stairs Stairs: Yes Stairs assistance: Supervision Stair  Management: One rail Right;Forwards Number of Stairs: 3 (2x's) General stair comments: cues for technique  Wheelchair Mobility    Modified Rankin (Stroke Patients Only)       Balance                                    Cognition Arousal/Alertness: Awake/alert Behavior During Therapy: WFL for tasks assessed/performed Overall Cognitive Status: Within Functional Limits for tasks assessed                      Exercises      General Comments        Pertinent Vitals/Pain Pain Assessment: 0-10 Pain Score: 4  Pain Location: incisional Pain Descriptors / Indicators: Dull Pain Intervention(s): Repositioned;Monitored during session (RN aware)    Home Living                      Prior Function            PT Goals (current goals can now be found in the care plan section) Acute Rehab PT Goals Patient Stated Goal: to go home PT Goal Formulation: With patient Time For Goal Achievement: 11/24/14 Potential to Achieve Goals: Good Progress towards PT goals: Progressing toward goals    Frequency  Min 5X/week    PT Plan Current plan remains appropriate    Co-evaluation             End of Session Equipment Utilized During Treatment: Back brace Activity Tolerance: Patient tolerated treatment well Patient left: in chair;with call bell/phone within reach     Time: 0837-0901 PT Time Calculation (min) (ACUTE ONLY):  24 min  Charges:  $Gait Training: 23-37 mins                    G Codes:      Sena Hitch 11/11/2014, [place], [place] [phone] 11/11/2014

## 2014-11-11 NOTE — Progress Notes (Addendum)
Spoke to Nicholas Vazquez, states he will need 3n1 which CM  delivered out of [employer] closet. CM will fax all orders to [employer] on Monday [phone]) When Discharge summary available.If sent to [facility] [phone]) please make sure all info is sent to  Attn: Vaughan Basta.  Pt awaiting BM for Discharge.

## 2014-11-12 MED ORDER — MAGNESIUM CITRATE PO SOLN
1.0000 | Freq: Once | ORAL | Status: AC
  Administered 2014-11-12: 1 via ORAL
  Filled 2014-11-12: qty 296

## 2014-11-12 NOTE — Progress Notes (Signed)
CM called Merry Proud with [facility] to deliver Rw to bedside.

## 2014-11-12 NOTE — Progress Notes (Signed)
Physical Therapy Treatment Patient Details Name: Nicholas Vazquez. MRN: [id number] DOB: 12/16/1955 Today's Date: 11/12/2014    History of Present Illness 59 y.o. M admitted 11/09/2014 for Anterior Lumbar Fusion (1 Level), L5-S1, Abdominal Exposure. PMH: lupus, cancer, diabetes.     PT Comments    Pt moving very well.  Ambulated entire unit Mod I with RW, mod I with transfers, & supervision for bed mobility with cues to reinforce back precautions.  Patient safe to D/C from a mobility standpoint based on progression towards goals set on PT eval.    Follow Up Recommendations  Home health PT;Supervision for mobility/OOB     Equipment Recommendations  Rolling walker with 5" wheels;3in1 (PT)    Recommendations for Other Services       Precautions / Restrictions Precautions Precautions: Back Precaution Comments: pt able to recall 3/3 back precautions Required Braces or Orthoses: Spinal Brace Spinal Brace: Applied in sitting position Restrictions Weight Bearing Restrictions: No    Mobility  Bed Mobility Overal bed mobility: Needs Assistance Bed Mobility: Rolling;Sidelying to Sit;Sit to Sidelying Rolling: Supervision Sidelying to sit: Supervision     Sit to sidelying: Supervision General bed mobility comments: cues to reinforce back precautions   Transfers Overall transfer level: Modified independent Equipment used: Rolling walker (2 wheeled)                Ambulation/Gait Ambulation/Gait assistance: Modified independent (Device/Increase time) Ambulation Distance (Feet): 400 Feet Assistive device: Rolling walker (2 wheeled) Gait Pattern/deviations: Step-through pattern         Stairs            Wheelchair Mobility    Modified Rankin (Stroke Patients Only)       Balance                                    Cognition Arousal/Alertness: Awake/alert Behavior During Therapy: WFL for tasks assessed/performed Overall Cognitive  Status: Within Functional Limits for tasks assessed                      Exercises      General Comments        Pertinent Vitals/Pain Pain Assessment: 0-10 Pain Score: 4  Pain Location: incisional Pain Descriptors / Indicators: Dull Pain Intervention(s): Monitored during session;Repositioned    Home Living                      Prior Function            PT Goals (current goals can now be found in the care plan section) Acute Rehab PT Goals Patient Stated Goal: to go home PT Goal Formulation: With patient Time For Goal Achievement: 11/24/14 Potential to Achieve Goals: Good Progress towards PT goals: Progressing toward goals    Frequency  Min 5X/week    PT Plan Current plan remains appropriate    Co-evaluation             End of Session Equipment Utilized During Treatment: Back brace Activity Tolerance: Patient tolerated treatment well Patient left: in chair     Time: 1001-1019 PT Time Calculation (min) (ACUTE ONLY): 18 min  Charges:  $Gait Training: 23-37 mins                    G Codes:      Sena Hitch 11/12/2014, 1:07 PM   Claiborne Billings  [place], [place] 11/12/2014

## 2014-11-12 NOTE — Progress Notes (Signed)
Subjective: 3 Days Post-Op Procedure(s) (LRB): ANTERIOR LUMBAR FUSION 1 LEVEL L5 - S1 (N/A) ABDOMINAL EXPOSURE (N/A) Patient reports pain as mild to lower back.  Progressing with PT. Tolerating PO's. Positive flatus, but no BM. Denies SOb, CP, of calf pain.  Objective: Vital signs in last 24 hours: Temp:  [98.4 F (36.9 C)] 98.4 F (36.9 C) (11/06 0549) Pulse Rate:  [71-78] 71 (11/06 0549) Resp:  [17-18] 17 (11/06 0549) BP: (127-131)/(76-77) 127/76 mmHg (11/06 0549) SpO2:  [95 %-96 %] 95 % (11/06 0549)  Intake/Output from previous day: 11/05 0701 - 11/06 0700 In: 720 [P.O.:720] Out: 400 [Urine:400] Intake/Output this shift:    No results for input(s): HGB in the last 72 hours. No results for input(s): WBC, RBC, HCT, PLT in the last 72 hours. No results for input(s): NA, K, CL, CO2, BUN, CREATININE, GLUCOSE, CALCIUM in the last 72 hours. No results for input(s): LABPT, INR in the last 72 hours.  Well nourished. Alert and oriented x3. RRR, Lungs clear, BS x4. Abdomen soft and non tender. Right Calf soft and non tender. back dressing C/D/I. No DVT signs. Compartment soft. No signs of infection.  Right and left LE grossly neurovascular intact. Plantar and dorsi flexion intact.  Assessment/Plan: 3 Days Post-Op Procedure(s) (LRB): ANTERIOR LUMBAR FUSION 1 LEVEL L5 - S1 (N/A) ABDOMINAL EXPOSURE (N/A) Possible D/c home, awaiting a BM Bowel management Up with PT Continue current care Ginni Eichler L 11/12/2014, 10:16 AM

## 2014-11-13 NOTE — Progress Notes (Addendum)
Faxed [facility], Face -to-Face, HHPT order, DME: 3-n-1, RW orders, OR reports to [place]:  Amparo Bristol- @Fax  # [phone] per her request on Friday 11/10/2014. CM unable to fax copy of Discharge Summary as it was not dictated at time of this entry.

## 2014-11-27 NOTE — Discharge Summary (Signed)
Physician Discharge Summary  Patient ID: Nicholas Vazquez. MRN: [id number] DOB/AGE: 58-Jul-1957 58 y.o.  Admit date: 11/09/2014 Discharge date: 11/27/2014  Admission Diagnoses:  LBP and R leg radicular pain  Discharge Diagnoses:  Active Problems:   Back pain   Past Medical History  Diagnosis Date  . Acute back pain with sciatica     Midline Low back- Right-side  . Cancer ([place])   . Lupus ([place])   . History of kidney stones   . Arthritis     Lumbosacral DDD and  Rheumatoid  . GERD (gastroesophageal reflux disease)   . Enlarged prostate   . Diabetes mellitus without complication ([place])     Type II; pt stated it was due to steroid injections he was given  . Glaucoma     Bilateral  . Abnormal ultrasound of prostate November 07, 2014    Surgeries: Procedure(s): ANTERIOR LUMBAR FUSION 1 LEVEL L5 - S1 ABDOMINAL EXPOSURE on 11/09/2014   Consultants (if any): Treatment Team:  Angelia Mould, MD  Discharged Condition: Improved  Hospital Course: Nicholas Vazquez. is an 59 y.o. male who was admitted 11/09/2014 with a diagnosis of LBP and RLE radicular pain and went to the operating room on 11/09/2014 and underwent the above named procedures.  The pt was discharged on 11/10/14.  His hospital course was uneventful.   He was given perioperative antibiotics:  Anti-infectives    Start     Dose/Rate Route Frequency Ordered Stop   11/09/14 2200  hydroxychloroquine (PLAQUENIL) tablet 200 mg  Status:  Discontinued     200 mg Oral 2 times daily 11/09/14 1631 11/10/14 0818   11/09/14 1645  levofloxacin (LEVAQUIN) tablet 500 mg  Status:  Discontinued     500 mg Oral Daily 11/09/14 1631 11/09/14 1637   11/09/14 1645  ceFAZolin (ANCEF) IVPB 1 g/50 mL premix     1 g 100 mL/hr over 30 Minutes Intravenous Every 8 hours 11/09/14 1631 11/09/14 2353   11/09/14 0700  ceFAZolin (ANCEF) IVPB 2 g/50 mL premix     2 g 100 mL/hr over 30 Minutes Intravenous To [facility] 11/08/14  1214 11/09/14 0805    .  He was given sequential compression devices, early ambulation, and TED for DVT prophylaxis.  He benefited maximally from the hospital stay and there were no complications.    Recent vital signs:  Filed Vitals:   11/12/14 0549 11/12/14 1302  BP: 127/76 152/89  Pulse: 71 85  Temp: 98.4 F (36.9 C) 98.2 F (36.8 C)  Resp: 17 17    Recent laboratory studies:  Lab Results  Component Value Date   HGB 12.8* 10/31/2014   HGB 12.5* 04/13/2011   Lab Results  Component Value Date   WBC 4.8 10/31/2014   PLT 170 10/31/2014   No results found for: INR Lab Results  Component Value Date   NA 142 10/31/2014   K 4.0 10/31/2014   CL 106 10/31/2014   CO2 29 10/31/2014   BUN 16 10/31/2014   CREATININE 1.13 10/31/2014   GLUCOSE 128* 10/31/2014    Discharge Medications:     Medication List    STOP taking these medications        cyclobenzaprine 5 MG tablet  Commonly known as:  FLEXERIL     levofloxacin 500 MG tablet  Commonly known as:  LEVAQUIN     naproxen 500 MG tablet  Commonly known as:  NAPROSYN     traMADol 50 MG tablet  Commonly known as:  ULTRAM      TAKE these medications        BRIMONIDINE TARTRATE OP  Place 0.2 % into the left eye 2 (two) times daily.     docusate sodium 100 MG capsule  Commonly known as:  COLACE  Take 1 capsule (100 mg total) by mouth 3 (three) times daily as needed for mild constipation.     Dorzolamide HCl-Timolol Mal PF 22.3-6.8 MG/ML Soln  Apply 1 drop to eye 2 (two) times daily.     enoxaparin 30 MG/0.3ML injection  Commonly known as:  LOVENOX  Inject 0.3 mLs (30 mg total) into the skin every 12 (twelve) hours. 10 day supply     hydroxychloroquine 200 MG tablet  Commonly known as:  PLAQUENIL  Take 200 mg by mouth 2 (two) times daily. Sulfate     latanoprost 0.005 % ophthalmic solution  Commonly known as:  XALATAN  Place 1 drop into both eyes at bedtime.     methocarbamol 500 MG tablet  Commonly  known as:  ROBAXIN  Take 1 tablet (500 mg total) by mouth 3 (three) times daily as needed for muscle spasms.     omeprazole 40 MG capsule  Commonly known as:  PRILOSEC  Take 40 mg by mouth daily.     ondansetron 4 MG tablet  Commonly known as:  ZOFRAN  Take 1 tablet (4 mg total) by mouth every 8 (eight) hours as needed for nausea or vomiting.     oxyCODONE-acetaminophen 10-325 MG tablet  Commonly known as:  PERCOCET  Take 1 tablet by mouth every 4 (four) hours as needed for pain.     polyethylene glycol powder powder  Commonly known as:  GLYCOLAX  Take 17 g by mouth daily.     tamsulosin 0.4 MG Caps capsule  Commonly known as:  FLOMAX  TAKE 1 CAPSULE (0.4 MG TOTAL) BY MOUTH ONCE DAILY. TAKE 30 MINUTES AFTER SAME MEAL EACH DAY.        Diagnostic Studies: Dg Lumbar Spine 2-3 Views  11/10/2014  CLINICAL DATA:  Postop from lumbar spine fusion. EXAM: LUMBAR SPINE - 2-3 VIEW COMPARISON:  11/09/2014 FINDINGS: Surgical screw is seen within the S1 vertebral body, with small metallic density in the [id number] intervertebral disc space. This is stable in appearance compared to previous study. Alignment is normal. IMPRESSION: Surgical hardware at L5-S1. Electronically Signed   By: Earle Gell M.D.   On: 11/10/2014 08:14   Dg Lumbar Spine 2-3 Views  11/09/2014  CLINICAL DATA:  Lumbar spine surgery. EXAM: DG C-ARM 61-120 MIN; LUMBAR SPINE - 2-3 VIEW COMPARISON:  None. FINDINGS: Surgical screws in the sacrum. Screw is intact. Metallic density is noted at the L5-S1 disc space. One image obtained. 1 minutes 4 seconds fluoroscopy time . IMPRESSION: Postsurgical changes lumbosacral spine. Electronically Signed   By: Marcello Moores  Register   On: 11/09/2014 11:28   Dg C-arm 61-120 Min  11/09/2014  CLINICAL DATA:  Lumbar spine surgery. EXAM: DG C-ARM 61-120 MIN; LUMBAR SPINE - 2-3 VIEW COMPARISON:  None. FINDINGS: Surgical screws in the sacrum. Screw is intact. Metallic density is noted at the L5-S1 disc space. One  image obtained. 1 minutes 4 seconds fluoroscopy time . IMPRESSION: Postsurgical changes lumbosacral spine. Electronically Signed   By: Marcello Moores  Register   On: 11/09/2014 11:28   Dg Or Local Abdomen  11/09/2014  CLINICAL DATA:  Sedation. Patient in surgery. Instrument count protocol. EXAM: OR LOCAL ABDOMEN COMPARISON:  None.  FINDINGS: There is a tiny radiodensity in the projection of the left side of the L5-S1 disc space which is presumably the marker for the L5-S1 cage. Screw is identified within the projection of the S1 vertebra. There are 2 small staples adjacent to the screw. Right renal calculi again noted. Bowel gas pattern is unremarkable. No suspicious foreign bodies identified at this time. IMPRESSION: 1. No suspicious findings to suggest retained surgical instrument Electronically Signed   By: Kerby Moors M.D.   On: 11/09/2014 11:05    Disposition: 01-Home or Self Care        Follow-up Information    Follow up with Dahlia Bailiff, MD. Schedule an appointment as soon as possible for a visit in 2 weeks.   Specialty:  Orthopedic Surgery   Why:  For suture removal, For wound re-check, If symptoms worsen   Contact information:   [address] [place] [address] [phone]       Follow up with Amparo Bristol, RN CM.   Contact information:   [facility] [phone]      Follow up with [facility].   Why:  3n1    Contact information:   [address] [place] [address] [phone]        Signed: Valinda Hoar 11/27/2014, 12:27 PM

## 2015-01-07 DIAGNOSIS — C801 Malignant (primary) neoplasm, unspecified: Secondary | ICD-10-CM

## 2015-01-07 HISTORY — DX: Malignant (primary) neoplasm, unspecified: C80.1

## 2015-01-22 ENCOUNTER — Encounter: Payer: Self-pay | Admitting: *Deleted

## 2015-01-22 ENCOUNTER — Other Ambulatory Visit: Payer: Self-pay

## 2015-01-22 NOTE — Patient Instructions (Signed)
  Your procedure is scheduled on: 01-30-15 Report to [facility] To find out your arrival time please call [phone] between 1PM - 3PM on 01-29-15  Remember: Instructions that are not followed completely may result in serious medical risk, up to and including death, or upon the discretion of your surgeon and anesthesiologist your surgery may need to be rescheduled.    _X___ 1. Do not eat food or drink liquids after midnight. No gum chewing or hard candies.     _X___ 2. No Alcohol for 24 hours before or after surgery.   ____ 3. Bring all medications with you on the day of surgery if instructed.    ____ 4. Notify your doctor if there is any change in your medical condition     (cold, fever, infections).     Do not wear jewelry, make-up, hairpins, clips or nail polish.  Do not wear lotions, powders, or perfumes. You may wear deodorant.  Do not shave 48 hours prior to surgery. Men may shave face and neck.  Do not bring valuables to the hospital.    [facility] is not responsible for any belongings or valuables.               Contacts, dentures or bridgework may not be worn into surgery.  Leave your suitcase in the car. After surgery it may be brought to your room.  For patients admitted to the hospital, discharge time is determined by your  treatment team.   Patients discharged the day of surgery will not be allowed to drive home.   Please read over the following fact sheets that you were given:      _X___ Take these medicines the morning of surgery with A SIP OF WATER:    1. OMEPRAZOLE   2. TAMSULOSIN  3.   4.  5.  6.  ____ Fleet Enema (as directed)   ____ Use CHG Soap as directed  ____ Use inhalers on the day of surgery  ____ Stop metformin 2 days prior to surgery    ____ Take 1/2 of usual insulin dose the night before surgery and none on the morning of surgery.   ____ Stop Coumadin/Plavix/aspirin-N/A  _X___ Stop Anti-inflammatories-DO  NOT START TAKING MELOXICAM-NO NSAIDS OR ASA PRODUCTS-PERCOCET OK TO CONTINUE   ____ Stop supplements until after surgery.    ____ Bring C-Pap to the hospital.

## 2015-01-26 NOTE — H&P (Signed)
NAME:  ADHVIK, NANNA NO.:  [id number]  MEDICAL RECORD NO.:  [id number]  LOCATION:                                 FACILITY:  PHYSICIAN:  Maryan Puls          DATE OF BIRTH:  Dec 07, 1955  DATE OF ADMISSION:  01/30/2015 DATE OF DISCHARGE:                            HISTORY AND PHYSICAL   SAME-DAY SURGERY:  January 24.  CHIEF COMPLAINT:  Difficulty voiding.  HISTORY OF PRESENT ILLNESS:  The patient is a 60 year old African American male with recent urinary tract infections and a long history of difficulty voiding.  He was also noted to have elevated PSA, which was evaluated with ultrasound-guided biopsy.  Ultrasound indicated an 84 g prostate and 2 areas of Gleason-6 adenocarcinoma.  He received Trelstar injection.  He comes in now for photovaporization of the prostate with GreenLight laser for the significantly enlarged prostate gland.  PAST MEDICAL HISTORY:  The patient was allergic to sulfa drugs.  CURRENT MEDICATIONS: 1. Hydrochloroquine. 2. Naproxen. 3. Omeprazole. 4. Latanoprost. 5. Dorzolamide. 6. Timolol.  PREVIOUS SURGICAL PROCEDURES:  No previous surgical procedures.  SOCIAL HISTORY:  The patient quit smoking 30 years ago with a 5-pack year history.  The patient denied alcohol use.  FAMILY HISTORY:  Father is living aged 69 and has prostate cancer. Mother died at age 46 with Alzheimer disease.  PAST AND CURRENT MEDICAL CONDITIONS: 1. Diabetes. 2. GERD. 3. Glaucoma. 4. Lupus arthritis. 5. Erectile dysfunction. 6. Hypogonadism.  REVIEW OF SYSTEMS:  Patient denied chest pain, shortness of breath, stroke, or heart disease.  PHYSICAL EXAMINATION:  GENERAL:  Generally well-nourished, Afro-American male, in no acute distress.  HEENT:  Sclerae were clear.  NECK:  Supple. No palpable cervical adenopathy.  LUNGS:  Clear to auscultation. CARDIOVASCULAR:  Regular rhythm and rate without audible murmurs. ABDOMEN:  Soft, nontender abdomen.  GU:   Uncircumcised testes, atrophic, 16 mL size each.  RECTAL:  Greater than 50 g, smooth, nontender prostate.  NEUROMUSCULAR:  Alert and oriented x3.  IMPRESSION: 1. Bladder outlet obstruction secondary to significant benign     prostatic hypertrophy. 2. Focal adenocarcinoma of prostate. 3. High-grade prostatic intraepithelial neoplasia.  PLAN:  Photovaporization of prostate with Julianne Rice laser.          ______________________________ Maryan Puls     MW/MEDQ  D:  01/25/2015  T:  01/25/2015  Job:  [id number]

## 2015-01-30 ENCOUNTER — Ambulatory Visit
Admission: RE | Admit: 2015-01-30 | Discharge: 2015-01-30 | Disposition: A | Discharge status: Home / Self Care | Payer: Managed Care, Other (non HMO) | Source: Ambulatory Visit | Attending: Urology | Admitting: Urology

## 2015-01-30 ENCOUNTER — Ambulatory Visit: Payer: Managed Care, Other (non HMO) | Admitting: Anesthesiology

## 2015-01-30 ENCOUNTER — Encounter
Admission: RE | Disposition: A | Discharge status: Home / Self Care | Payer: Self-pay | Source: Ambulatory Visit | Attending: Urology

## 2015-01-30 ENCOUNTER — Encounter: Payer: Self-pay | Admitting: *Deleted

## 2015-01-30 DIAGNOSIS — H409 Unspecified glaucoma: Secondary | ICD-10-CM | POA: Diagnosis not present

## 2015-01-30 DIAGNOSIS — N320 Bladder-neck obstruction: Secondary | ICD-10-CM | POA: Insufficient documentation

## 2015-01-30 DIAGNOSIS — M329 Systemic lupus erythematosus, unspecified: Secondary | ICD-10-CM | POA: Diagnosis not present

## 2015-01-30 DIAGNOSIS — Z8042 Family history of malignant neoplasm of prostate: Secondary | ICD-10-CM | POA: Insufficient documentation

## 2015-01-30 DIAGNOSIS — N401 Enlarged prostate with lower urinary tract symptoms: Secondary | ICD-10-CM | POA: Diagnosis not present

## 2015-01-30 DIAGNOSIS — Z820 Family history of epilepsy and other diseases of the nervous system: Secondary | ICD-10-CM | POA: Insufficient documentation

## 2015-01-30 DIAGNOSIS — C61 Malignant neoplasm of prostate: Secondary | ICD-10-CM | POA: Diagnosis not present

## 2015-01-30 DIAGNOSIS — E291 Testicular hypofunction: Secondary | ICD-10-CM | POA: Diagnosis not present

## 2015-01-30 DIAGNOSIS — E119 Type 2 diabetes mellitus without complications: Secondary | ICD-10-CM | POA: Insufficient documentation

## 2015-01-30 DIAGNOSIS — Z79899 Other long term (current) drug therapy: Secondary | ICD-10-CM | POA: Insufficient documentation

## 2015-01-30 DIAGNOSIS — Z882 Allergy status to sulfonamides status: Secondary | ICD-10-CM | POA: Insufficient documentation

## 2015-01-30 DIAGNOSIS — M1380 Other specified arthritis, unspecified site: Secondary | ICD-10-CM | POA: Insufficient documentation

## 2015-01-30 DIAGNOSIS — N138 Other obstructive and reflux uropathy: Secondary | ICD-10-CM | POA: Insufficient documentation

## 2015-01-30 DIAGNOSIS — K219 Gastro-esophageal reflux disease without esophagitis: Secondary | ICD-10-CM | POA: Diagnosis not present

## 2015-01-30 DIAGNOSIS — Z87891 Personal history of nicotine dependence: Secondary | ICD-10-CM | POA: Diagnosis not present

## 2015-01-30 HISTORY — DX: Chronic kidney disease, unspecified: N18.9

## 2015-01-30 HISTORY — PX: GREEN LIGHT LASER TURP (TRANSURETHRAL RESECTION OF PROSTATE: SHX6260

## 2015-01-30 LAB — GLUCOSE, CAPILLARY
Glucose-Capillary: 94 mg/dL (ref 65–99)
Glucose-Capillary: 110 mg/dL — ABNORMAL HIGH (ref 65–99)

## 2015-01-30 SURGERY — GREEN LIGHT LASER TURP (TRANSURETHRAL RESECTION OF PROSTATE
Anesthesia: General | Wound class: Clean Contaminated

## 2015-01-30 MED ORDER — LIDOCAINE HCL 2 % EX GEL
CUTANEOUS | Status: DC | PRN
  Administered 2015-01-30: 1

## 2015-01-30 MED ORDER — MIDAZOLAM HCL 2 MG/2ML IJ SOLN
INTRAMUSCULAR | Status: DC | PRN
  Administered 2015-01-30: 2 mg via INTRAVENOUS

## 2015-01-30 MED ORDER — LEVOFLOXACIN 500 MG PO TABS: 500.0000 mg | ORAL_TABLET | Freq: Every day | ORAL | Status: DC

## 2015-01-30 MED ORDER — HYDROCODONE-ACETAMINOPHEN 7.5-325 MG PO TABS
ORAL_TABLET | ORAL | Status: AC
  Administered 2015-01-30: 1 via ORAL
  Filled 2015-01-30: qty 1

## 2015-01-30 MED ORDER — CEFAZOLIN SODIUM 1-5 GM-% IV SOLN
INTRAVENOUS | Status: AC
  Administered 2015-01-30: 1 g via INTRAVENOUS
  Filled 2015-01-30: qty 50

## 2015-01-30 MED ORDER — PROPOFOL 10 MG/ML IV BOLUS
INTRAVENOUS | Status: DC | PRN
  Administered 2015-01-30: 130 mg via INTRAVENOUS

## 2015-01-30 MED ORDER — ONDANSETRON 8 MG PO TBDP: 8.0000 mg | ORAL_TABLET | Freq: Four times a day (QID) | ORAL | Status: DC | PRN

## 2015-01-30 MED ORDER — BELLADONNA ALKALOIDS-OPIUM 16.2-60 MG RE SUPP
RECTAL | Status: AC
  Filled 2015-01-30: qty 1

## 2015-01-30 MED ORDER — FAMOTIDINE 20 MG PO TABS
20.0000 mg | ORAL_TABLET | Freq: Once | ORAL | Status: AC
  Administered 2015-01-30: 20 mg via ORAL

## 2015-01-30 MED ORDER — UROGESIC-BLUE 81.6 MG PO TABS: 1.0000 | ORAL_TABLET | Freq: Four times a day (QID) | ORAL | Status: DC | PRN

## 2015-01-30 MED ORDER — CEFAZOLIN SODIUM 1-5 GM-% IV SOLN
1.0000 g | Freq: Once | INTRAVENOUS | Status: AC
  Administered 2015-01-30: 1 g via INTRAVENOUS

## 2015-01-30 MED ORDER — FENTANYL CITRATE (PF) 100 MCG/2ML IJ SOLN
INTRAMUSCULAR | Status: DC | PRN
  Administered 2015-01-30 (×2): 50 ug via INTRAVENOUS
  Administered 2015-01-30 (×3): 25 ug via INTRAVENOUS

## 2015-01-30 MED ORDER — LIDOCAINE HCL 2 % EX GEL
CUTANEOUS | Status: AC
  Filled 2015-01-30: qty 10

## 2015-01-30 MED ORDER — HYDROCODONE-ACETAMINOPHEN 7.5-325 MG PO TABS
1.0000 | ORAL_TABLET | ORAL | Status: DC | PRN
  Administered 2015-01-30: 1 via ORAL

## 2015-01-30 MED ORDER — SODIUM CHLORIDE 0.9 % IV SOLN
INTRAVENOUS | Status: DC
  Administered 2015-01-30: 13:00:00 via INTRAVENOUS

## 2015-01-30 MED ORDER — BELLADONNA ALKALOIDS-OPIUM 16.2-60 MG RE SUPP
RECTAL | Status: DC | PRN
  Administered 2015-01-30: 1 via RECTAL

## 2015-01-30 MED ORDER — HYDROCODONE-ACETAMINOPHEN 7.5-325 MG PO TABS: 1.0000 | ORAL_TABLET | ORAL | Status: DC | PRN

## 2015-01-30 MED ORDER — EPHEDRINE SULFATE 50 MG/ML IJ SOLN
INTRAMUSCULAR | Status: DC | PRN
  Administered 2015-01-30: 10 mg via INTRAVENOUS

## 2015-01-30 SURGICAL SUPPLY — 28 items
ADAPTER IRRIG TUBE 2 SPIKE SOL (ADAPTER) ×6 IMPLANT
BAG URO DRAIN 2000ML W/SPOUT (MISCELLANEOUS) ×3 IMPLANT
CATH FOLEY 2WAY  5CC 20FR SIL (CATHETERS) ×2
CATH FOLEY 2WAY 5CC 20FR SIL (CATHETERS) ×1 IMPLANT
GLOVE BIO SURGEON STRL SZ7 (GLOVE) ×6 IMPLANT
GLOVE BIO SURGEON STRL SZ7.5 (GLOVE) ×3 IMPLANT
GOWN STRL REUS W/ TWL LRG LVL3 (GOWN DISPOSABLE) ×1 IMPLANT
GOWN STRL REUS W/ TWL XL LVL3 (GOWN DISPOSABLE) ×1 IMPLANT
GOWN STRL REUS W/TWL LRG LVL3 (GOWN DISPOSABLE) ×2
GOWN STRL REUS W/TWL XL LVL3 (GOWN DISPOSABLE) ×2
IV NS 1000ML (IV SOLUTION) ×2
IV NS 1000ML BAXH (IV SOLUTION) ×1 IMPLANT
IV SET PRIMARY 15D 139IN B9900 (IV SETS) ×3 IMPLANT
KIT RM TURNOVER CYSTO AR (KITS) ×3 IMPLANT
LASER GREENLIGHT XPS PROCEDURE (MISCELLANEOUS) ×3 IMPLANT
LASER GRNLGT MOXY FIBER 750UM (MISCELLANEOUS) ×3 IMPLANT
PACK CYSTO AR (MISCELLANEOUS) ×3 IMPLANT
PREP PVP WINGED SPONGE (MISCELLANEOUS) ×3 IMPLANT
SET IRRIG Y TYPE TUR BLADDER L (SET/KITS/TRAYS/PACK) ×3 IMPLANT
SOL .9 NS 3000ML IRR  AL (IV SOLUTION) ×8
SOL .9 NS 3000ML IRR UROMATIC (IV SOLUTION) ×4 IMPLANT
SOL PREP PVP 2OZ (MISCELLANEOUS) ×3
SOLUTION PREP PVP 2OZ (MISCELLANEOUS) ×1 IMPLANT
SURGILUBE 2OZ TUBE FLIPTOP (MISCELLANEOUS) ×3 IMPLANT
SYRINGE IRR TOOMEY STRL 70CC (SYRINGE) ×3 IMPLANT
TUBING CONNECTING 10 (TUBING) ×2 IMPLANT
TUBING CONNECTING 10' (TUBING) ×1
WATER STERILE IRR 1000ML POUR (IV SOLUTION) ×3 IMPLANT

## 2015-01-30 NOTE — Discharge Instructions (Addendum)
Benign Prostatic Hyperplasia An enlarged prostate (benign prostatic hyperplasia) is common in older men. You may experience the following:  Weak urine stream.  Dribbling.  Feeling like the bladder has not emptied completely.  Difficulty starting urination.  Getting up frequently at night to urinate.  Urinating more frequently during the day. HOME CARE INSTRUCTIONS  Monitor your prostatic hyperplasia for any changes. The following actions may help to alleviate any discomfort you are experiencing:  Give yourself time when you urinate.  Stay away from alcohol.  Avoid beverages containing caffeine, such as coffee, tea, and colas, because they can make the problem worse.  Avoid decongestants, antihistamines, and some prescription medicines that can make the problem worse.  Follow up with your health care provider for further treatment as recommended. SEEK MEDICAL CARE IF:  You are experiencing progressive difficulty voiding.  Your urine stream is progressively getting narrower.  You are awaking from sleep with the urge to void more frequently.  You are constantly feeling the need to void.  You experience loss of urine, especially in small amounts. SEEK IMMEDIATE MEDICAL CARE IF:   You develop increased pain with urination or are unable to urinate.  You develop severe abdominal pain, vomiting, a high fever, or fainting.  You develop back pain or blood in your urine. MAKE SURE YOU:   Understand these instructions.  Will watch your condition.  Will get help right away if you are not doing well or get worse.   This information is not intended to replace advice given to you by your health care provider. Make sure you discuss any questions you have with your health care provider.   Document Released: 12/23/2004 Document Revised: 01/13/2014 Document Reviewed: 05/25/2012 Elsevier Interactive Patient Education 2016 [place] Light Laser Prostate Treatment Green  light laser therapy is a procedure that uses a special high-energy laser for vaporizing extra prostate tissue. It is less invasive than traditional methods of prostate surgery, which involve cutting out the prostate tissue. Because the tissue is vaporized rather than cut out there is generally less blood loss. LET [facility] CARE PROVIDER KNOW ABOUT:  Any allergies you have.  Any medicines you are taking, including vitamins, herbs, eye drops, creams, and over-the-counter medication.  Previous problems you or members of your family have had with the use of anesthetics.  Any blood disorders you have.  Previous surgeries you have had.  Medical conditions you have. RISKS AND COMPLICATIONS Generally, green light laser prostate treatment is a safe procedure. However, as with any procedure, complications can occur. Possible complications include:  Urinary tract infection.  Erectile dysfunction (rare).  Dry ejaculation--Semen is not released when you reach sexual climax.  Scar tissue in the urinary passage. BEFORE THE PROCEDURE   Your health care provider may discuss medicines you are taking and may advise you to stop taking specific ones.  You may be given antibiotic medicine to take as a precaution against bacterial infection.  Do not eat or drink anything for 8 hours before your procedure or as directed by your health care provider. You may have a sip of water to take any necessary medicines. PROCEDURE Depending on the size and shape of your prostate, the procedure may take 30-60 minutes. You will be given one of the following:   A medicine that makes you go to sleep (general anesthetic).  A medicine injected into your spine that numbs your body below the waist (spinal anesthetic). Sedation is usually given with spinal anesthetic so  you will be relaxed. A tube containing viewing scopes and instruments will be inserted through your penis so that no cuts (incisions) are needed. A thin  fiber is put through the tube and positioned next to the excess prostate tissue. Pulses of laser light come from the end of the fiber and are projected onto the excess tissue. The laser beam is absorbed by your blood, which becomes hot enough to vaporize the excess prostate tissue. This laser beam will seal off the blood vessels, decreasing bleeding. The tube with the viewing scopes, instruments, and thin fiber will be removed and replaced with a temporary catheter. AFTER THE PROCEDURE  After the surgery, you will be sent to the recovery room for a short time. Depending on factors such as the amount of prostate tissue vaporized, the strength of your bladder, and the amount of bleeding expected, the catheter may be removed. Generally, overnight stay is not needed and you will be sent home on the same day as the procedure. You may be sent home with elastic support stockings to help prevent blood clots in your legs.    This information is not intended to replace advice given to you by your health care provider. Make sure you discuss any questions you have with your health care provider.   Document Released: 04/01/2007 Document Revised: 12/28/2012 Document Reviewed: 06/14/2012 Elsevier Interactive Patient Education 2016 [employer]  Benign Prostatic Hypertrophy The prostate gland is part of the reproductive system of men. A normal prostate is about the size and shape of a walnut. The prostate gland produces a fluid that is mixed with sperm to make semen. This gland surrounds the urethra and is located in front of the rectum and just below the bladder. The bladder is where urine is stored. The urethra is the tube through which urine passes from the bladder to get out of the body. The prostate grows as a man ages. An enlarged prostate not caused by cancer is called benign prostatic hypertrophy (BPH). An enlarged prostate can press on the urethra. This can make it harder to pass urine. In the early stages of  enlargement, the bladder can get by with a narrowed urethra by forcing the urine through. If the problem gets worse, medical or surgical treatment may be required.  This condition should be followed by your health care provider. The accumulation of urine in the bladder can cause infection. Back pressure and infection can progress to bladder damage and kidney (renal) failure. If needed, your health care provider may refer you to a specialist in kidney and prostate disease (urologist). CAUSES  BPH is a common health problem in men older than 50 years. This condition is a normal part of aging. However, not all men will develop problems from this condition. If the enlargement grows away from the urethra, then there will not be any compression of the urethra and resistance to urine flow.If the growth is toward the urethra and compresses it, you will experience difficulty urinating.  SYMPTOMS   Not able to completely empty your bladder.  Getting up often during the night to urinate.  Need to urinate frequently during the day.  Difficultly starting urine flow.  Decrease in size and strength of your urine stream.  Dribbling after urination.  Pain on urination (more common with infection).  Inability to pass urine. This needs immediate treatment.  The development of a urinary tract infection. DIAGNOSIS  These tests will help your health care provider understand your problem:  A  thorough history and physical examination.  A urination history, with the number of times you urinate, the amounts of urine, the strength of the urine stream, and the feeling of emptiness or fullness after urinating.  A postvoid bladder scan that measures any amount of urine that may remain in your bladder after you finish urinating.  Digital rectal exam. In a rectal exam, your health care provider checks your prostate by putting a gloved, lubricated finger into your rectum to feel the back of your prostate gland. This  exam detects the size of your gland and abnormal lumps or growths.  Exam of your urine (urinalysis).  Prostate specific antigen (PSA) screening. This is a blood test used to screen for prostate cancer.  Rectal ultrasonography. This test uses sound waves to electronically produce a picture of your prostate gland. TREATMENT  Once symptoms begin, your health care provider will monitor your condition. Of the men with this condition, one third will have symptoms that stabilize, one third will have symptoms that improve, and one third will have symptoms that progress in the first year. Mild symptoms may not need treatment. Simple observation and yearly exams may be all that is required. Medicines and surgery are options for more severe problems. Your health care provider can help you make an informed decision for what is best. Two classes of medicines are available for relief of prostate symptoms:  Medicines that shrink the prostate. This helps relieve symptoms. These medicines take time to work, and it may be months before any improvement is seen.  Uncommon side effects include problems with sexual function.  Medicines to relax the muscle of the prostate. This also relieves the obstruction by reducing any compression on the urethra.This group of medicines work much faster than those that reduce the size of the prostate gland. Usually, one can experience improvement in days to weeks..  Side effects can include dizziness, fatigue, lightheadedness, and retrograde ejaculation (diminished volume of ejaculate). Several types of surgical treatments are available for relief of prostate symptoms:  Transurethral resection of the prostate (TURP)--In this treatment, an instrument is inserted through opening at the tip of the penis. It is used to cut away pieces of the inner core of the prostate. The pieces are removed through the same opening of the penis. This removes the obstruction and helps get rid of the  symptoms.  Transurethral incision (TUIP)--In this procedure, small cuts are made in the prostate. This lessens the prostates pressure on the urethra.  Transurethral microwave thermotherapy (TUMT)--This procedure uses microwaves to create heat. The heat destroys and removes a small amount of prostate tissue.  Transurethral needle ablation (TUNA)--This is a procedure that uses radio frequencies to do the same as TUMT.  Interstitial laser coagulation (ILC)--This is a procedure that uses a laser to do the same as TUMT and TUNA.  Transurethral electrovaporization (TUVP)--This is a procedure that uses electrodes to do the same as the procedures listed above. SEEK MEDICAL CARE IF:   You develop a fever.  There is unexplained back pain.  Symptoms are not helped by medicines prescribed.  You develop side effects from the medicine you are taking.  Your urine becomes very dark or has a bad smell.  Your lower abdomen becomes distended and you have difficulty passing your urine. SEEK IMMEDIATE MEDICAL CARE IF:   You are suddenly unable to urinate. This is an emergency. You should be seen immediately.  There are large amounts of blood or clots in the urine.  Your  urinary problems become unmanageable.  You develop lightheadedness, severe dizziness, or you feel faint.  You develop moderate to severe low back or flank pain.  You develop chills or fever.   This information is not intended to replace advice given to you by your health care provider. Make sure you discuss any questions you have with your health care provider.   Document Released: 12/23/2004 Document Revised: 12/28/2012 Document Reviewed: 07/08/2012 Elsevier Interactive Patient Education 2016 [facility] Prostate laser surgery is a procedure to eliminate prostate tissue. There are two types of prostate laser surgery: ablation (prostate tissue is melted away) and enucleation (prostate tissue is cut  out). LET [facility] CARE PROVIDER KNOW ABOUT:  Any allergies you have.  All medicines you are taking, including vitamins, herbs, eye drops, creams, and over-the-counter medicines.  Previous problems you or members of your family have had with the use of anesthetics.  Any blood disorders you have.  Previous surgeries you have had.  Medical conditions you have. RISKS AND COMPLICATIONS  Generally prostate laser surgery is a safe procedure. However, as with any procedure, problems can occur. Possible problems include:  Bleeding and the need for a blood transfusion.   Urinary tract infection.  Erectile dysfunction.  Narrowing (scar or stricture) of the urethra, which blocks the flow of urine.  Dry ejaculation (semen is not released when you reach sexual climax). BEFORE THE PROCEDURE   If you are on blood thinners, such as warfarin or clopidogrel, or nonprescription pain-relieving medicines, such as naproxen sodium or ibuprofen, you may be asked to stop taking them before the procedure.  Your health care provider may ask you to start taking antibiotic medicines before the procedure as a precaution against a bacterial infection. The procedure will not be performed if your urine is infected.  You should have nothing to eat or drink for at least 8 hours before your procedure, or as suggested by your health care provider. You may have a sip of water to take medications not stopped for the procedure. PROCEDURE  You will be given one of the following:   A medicine that numbs the area (local anesthetic).  A medicine injected into your spine that numbs your body below the waist (spinal anesthetic). A sedative is usually given with spinal anesthetic so you will be relaxed during the procedure. A viewing scope and instruments will be placed in a tube that is inserted through your penis, so no incisions will be needed to insert the scope and instruments. Depending on the type of laser used,  the prostate tissue will either be vaporized or cut away. The laser beam will coagulate any small bleeding areas. At the end of the surgery, a special tube will be inserted into your bladder to drain the urine from your bladder (urinary catheter). AFTER THE PROCEDURE You will be sent to the recovery room for a short time. In the recovery room, you will receive fluids through an IV tube inserted in one of your veins. Your blood pressure and pulse will be checked frequently to make sure that they stabilize. Once you are eating and drinking fluids appropriately, the IV tube will be removed.  Depending on your specific needs, you may be admitted to the hospital or you will be sent home after the procedure. If you are sent home:  You may be sent home with elastic support stockings to help prevent blood clots in your legs.  You will also probably be given an  antibiotic medicine.  Unless told otherwise, you may restart your other medications.  You may be given a stool softener.   This information is not intended to replace advice given to you by your health care provider. Make sure you discuss any questions you have with your health care provider.   Document Released: 12/23/2004 Document Revised: 12/28/2012 Document Reviewed: 06/14/2012 Elsevier Interactive Patient Education 2016 [place]   1) The drugs that you were given will stay in your system until tomorrow so for the next 24 hours you should not:  A) Drive an automobile B) Make any legal decisions C) Drink any alcoholic beverage   2) You may resume regular meals tomorrow.  Today it is better to start with liquids and gradually work up to solid foods.  You may eat anything you prefer, but it is better to start with liquids, then soup and crackers, and gradually work up to solid foods.   3) Please notify your doctor immediately if you have any unusual bleeding, trouble breathing, redness  and pain at the surgery site, drainage, fever, or pain not relieved by medication.    4) Additional Instructions:        Please contact your physician with any problems or Same Day Surgery at [phone], Monday through Friday 6 am to 4 pm, or [place] at [facility] number at [phone].

## 2015-01-30 NOTE — Anesthesia Procedure Notes (Signed)
Procedure Name: LMA Insertion Date/Time: 01/30/2015 1:03 PM Performed by: Rosaria Ferries, Camara Rosander Pre-anesthesia Checklist: Patient identified, Emergency Drugs available, Suction available and Patient being monitored Patient Re-evaluated:Patient Re-evaluated prior to inductionOxygen Delivery Method: Circle system utilized Preoxygenation: Pre-oxygenation with 100% oxygen Intubation Type: IV induction LMA: LMA inserted LMA Size: 5.0 Number of attempts: 1 Tube secured with: Tape Dental Injury: Teeth and Oropharynx as per pre-operative assessment

## 2015-01-30 NOTE — Anesthesia Postprocedure Evaluation (Signed)
Anesthesia Post Note  Patient: Nicholas Vazquez.  Procedure(s) Performed: Procedure(s) (LRB): GREEN LIGHT LASER TURP (TRANSURETHRAL RESECTION OF PROSTATE (N/A)  Patient location during evaluation: PACU Anesthesia Type: General Level of consciousness: awake Pain management: pain level controlled Vital Signs Assessment: post-procedure vital signs reviewed and stable Respiratory status: spontaneous breathing Cardiovascular status: blood pressure returned to baseline Anesthetic complications: no    Last Vitals:  Filed Vitals:   01/30/15 1501 01/30/15 1524  BP: 153/91 148/89  Pulse: 64 66  Temp:    Resp: 14 14    Last Pain:  Filed Vitals:   01/30/15 1525  PainSc: [place]

## 2015-01-30 NOTE — Transfer of Care (Signed)
Immediate Anesthesia Transfer of Care Note  Patient: Nicholas Vazquez.  Procedure(s) Performed: Procedure(s): GREEN LIGHT LASER TURP (TRANSURETHRAL RESECTION OF PROSTATE (N/A)  Patient Location: PACU  Anesthesia Type:General  Level of Consciousness: sedated  Airway & Oxygen Therapy: Patient Spontanous Breathing and Patient connected to face mask oxygen  Post-op Assessment: Report given to RN and Post -op Vital signs reviewed and stable  Post vital signs: Reviewed and stable  Last Vitals:  Filed Vitals:   01/30/15 1205  BP: 128/84  Pulse: 78  Temp: 36.8 C  Resp: 16    Complications: No apparent anesthesia complications

## 2015-01-30 NOTE — H&P (Signed)
Date of Initial H&P: 01/25/15  History reviewed, patient examined, no change in status, stable for surgery.

## 2015-01-30 NOTE — Op Note (Signed)
Preoperative diagnosis: 1. BPH with bladder outlet obstruction                                            2. Prostate cancer Postoperative diagnosis: Same  Procedure: Photovaporization of the prostate with greenlight laser   Surgeon: Otelia Limes. Yves Dill MD, FACS Anesthesia: Gen.  Indications:See the history and physical. After informed consent the above procedure(s) were requested     Technique and findings: After adequate general anesthesia had been obtained the patient was placed into dorsal lithotomy position and the perineum was prepped and draped in the usual fashion. The laser scope was coupled to the camera and then visually advanced into the bladder. Bladder was moderately trabeculated. No bladder tumors were identified. Patient had trilobar BPH with intravesical growth of a large median lobe. The greenlight XPS laser fiber was introduced through the scope and set at power setting of 1 W. The bladder neck tissue and median lobe was vaporized. Power was then increased to 180 W and remaining obstructive tissue and the bladder neck to the verumontanum was vaporized. Liters were attended to with the laser. At this point the scope was removed and 10 cc of viscous Xylocaine instilled within the urethra and the bladder. A 20 French silicone catheter was placed. Catheter was irrigated until clear. A B&O suppository was placed. The procedure was then terminated and the patient transferred to the recovery room in stable condition.

## 2015-01-30 NOTE — Progress Notes (Signed)
Arousing. Oral airway d/c'd. Tolerated well. O2 continued.

## 2015-01-30 NOTE — Anesthesia Preprocedure Evaluation (Signed)
Anesthesia Evaluation  Patient identified by MRN, date of birth, ID band Patient awake    Reviewed: Allergy & Precautions, NPO status , Patient's Chart, lab work & pertinent test results  History of Anesthesia Complications Negative for: history of anesthetic complications  Airway Mallampati: II       Dental   Pulmonary neg pulmonary ROS, former smoker,           Cardiovascular negative cardio ROS       Neuro/Psych negative neurological ROS  negative psych ROS   GI/Hepatic Neg liver ROS, GERD  Medicated and Controlled,  Endo/Other  diabetes (no meds diet controlled)  Renal/GU Renal Insufficiency     Musculoskeletal  (+) Arthritis , Osteoarthritis,    Abdominal   Peds  Hematology negative hematology ROS (+)   Anesthesia Other Findings   Reproductive/Obstetrics                             Anesthesia Physical Anesthesia Plan  ASA: II  Anesthesia Plan: General   Post-op Pain Management:    Induction: Intravenous  Airway Management Planned: LMA  Additional Equipment:   Intra-op Plan:   Post-operative Plan:   Informed Consent: I have reviewed the patients History and Physical, chart, labs and discussed the procedure including the risks, benefits and alternatives for the proposed anesthesia with the patient or authorized representative who has indicated his/her understanding and acceptance.     Plan Discussed with:   Anesthesia Plan Comments:         Anesthesia Quick Evaluation

## 2015-01-31 ENCOUNTER — Encounter: Payer: Self-pay | Admitting: Urology

## 2015-03-27 DIAGNOSIS — C61 Malignant neoplasm of prostate: Secondary | ICD-10-CM | POA: Insufficient documentation

## 2015-03-27 DIAGNOSIS — Z0000 Encounter for general adult medical examination without abnormal findings: Secondary | ICD-10-CM | POA: Insufficient documentation

## 2016-04-22 ENCOUNTER — Ambulatory Visit
Admission: RE | Admit: 2016-04-22 | Discharge: 2016-04-22 | Disposition: A | Discharge status: Home / Self Care | Payer: Commercial Managed Care - PPO | Source: Ambulatory Visit | Attending: Internal Medicine | Admitting: Internal Medicine

## 2016-04-22 ENCOUNTER — Other Ambulatory Visit: Payer: Self-pay | Admitting: Internal Medicine

## 2016-04-22 DIAGNOSIS — H538 Other visual disturbances: Secondary | ICD-10-CM | POA: Diagnosis present

## 2016-04-22 DIAGNOSIS — N181 Chronic kidney disease, stage 1: Secondary | ICD-10-CM | POA: Insufficient documentation

## 2016-04-22 DIAGNOSIS — R29898 Other symptoms and signs involving the musculoskeletal system: Secondary | ICD-10-CM | POA: Diagnosis not present

## 2016-04-22 DIAGNOSIS — R42 Dizziness and giddiness: Secondary | ICD-10-CM | POA: Diagnosis not present

## 2016-07-14 ENCOUNTER — Emergency Department: Payer: Commercial Managed Care - PPO

## 2016-07-14 ENCOUNTER — Emergency Department
Admission: EM | Admit: 2016-07-14 | Discharge: 2016-07-14 | Disposition: A | Discharge status: Home / Self Care | Payer: Commercial Managed Care - PPO | Attending: Student in an Organized Health Care Education/Training Program | Admitting: Student in an Organized Health Care Education/Training Program

## 2016-07-14 ENCOUNTER — Encounter: Payer: Self-pay | Admitting: Emergency Medicine

## 2016-07-14 DIAGNOSIS — Z791 Long term (current) use of non-steroidal anti-inflammatories (NSAID): Secondary | ICD-10-CM | POA: Diagnosis not present

## 2016-07-14 DIAGNOSIS — Z79899 Other long term (current) drug therapy: Secondary | ICD-10-CM | POA: Diagnosis not present

## 2016-07-14 DIAGNOSIS — E1122 Type 2 diabetes mellitus with diabetic chronic kidney disease: Secondary | ICD-10-CM | POA: Insufficient documentation

## 2016-07-14 DIAGNOSIS — R0789 Other chest pain: Secondary | ICD-10-CM | POA: Insufficient documentation

## 2016-07-14 DIAGNOSIS — Z87891 Personal history of nicotine dependence: Secondary | ICD-10-CM | POA: Diagnosis not present

## 2016-07-14 DIAGNOSIS — N189 Chronic kidney disease, unspecified: Secondary | ICD-10-CM | POA: Insufficient documentation

## 2016-07-14 DIAGNOSIS — R079 Chest pain, unspecified: Secondary | ICD-10-CM | POA: Diagnosis present

## 2016-07-14 DIAGNOSIS — Z8546 Personal history of malignant neoplasm of prostate: Secondary | ICD-10-CM | POA: Diagnosis not present

## 2016-07-14 DIAGNOSIS — I129 Hypertensive chronic kidney disease with stage 1 through stage 4 chronic kidney disease, or unspecified chronic kidney disease: Secondary | ICD-10-CM | POA: Insufficient documentation

## 2016-07-14 HISTORY — DX: Essential (primary) hypertension: I10

## 2016-07-14 HISTORY — DX: Systemic involvement of connective tissue, unspecified: M35.9

## 2016-07-14 LAB — BASIC METABOLIC PANEL
Anion gap: 6 (ref 5–15)
BUN: 18 mg/dL (ref 6–20)
CO2: 27 mmol/L (ref 22–32)
Calcium: 9 mg/dL (ref 8.9–10.3)
Chloride: 107 mmol/L (ref 101–111)
Creatinine, Ser: 1.14 mg/dL (ref 0.61–1.24)
GFR calc Af Amer: >60 mL/min (ref >60)
GFR calc non Af Amer: >60 mL/min (ref >60)
Glucose, Bld: 93 mg/dL (ref 65–99)
Potassium: 4.3 mmol/L (ref 3.5–5.1)
Sodium: 140 mmol/L (ref 135–145)

## 2016-07-14 LAB — CBC
HCT: 36.8 % — ABNORMAL LOW (ref 40.0–52.0)
Hemoglobin: 12.7 g/dL — ABNORMAL LOW (ref 13.0–18.0)
MCH: 31.9 pg (ref 26.0–34.0)
MCHC: 34.4 g/dL (ref 32.0–36.0)
MCV: 92.7 fL (ref 80.0–100.0)
Platelets: 185 10*3/uL (ref 150–440)
RBC: 3.97 MIL/uL — ABNORMAL LOW (ref 4.40–5.90)
RDW: 12.9 % (ref 11.5–14.5)
WBC: 5.2 10*3/uL (ref 3.8–10.6)

## 2016-07-14 LAB — TROPONIN I
Troponin I: <0.03 ng/mL (ref <0.03)
Troponin I: <0.03 ng/mL (ref <0.03)

## 2016-07-14 MED ORDER — IOPAMIDOL (ISOVUE-370) INJECTION 76%
75.0000 mL | Freq: Once | INTRAVENOUS | Status: AC | PRN
  Administered 2016-07-14: 75 mL via INTRAVENOUS

## 2016-07-14 MED ORDER — SODIUM CHLORIDE 0.9 % IV BOLUS (SEPSIS)
1000.0000 mL | Freq: Once | INTRAVENOUS | Status: AC
  Administered 2016-07-14: 1000 mL via INTRAVENOUS

## 2016-07-14 NOTE — ED Triage Notes (Signed)
Pt c/o central chest pressure and tingling constant since Saturday.  Pain with deep breath. NAD. Respirations unlabored.  Skin warm and dry at this time.

## 2016-07-14 NOTE — ED Provider Notes (Signed)
[facility] Emergency Department Provider Note    First MD Initiated Contact with Patient 07/14/16 1232     (approximate)  I have reviewed Nicholas Vazquez triage vital signs and Nicholas [facility]azquez nursing notes.   HISTORY  Chief Complaint Chest Pain    HPI Nicholas Nicholas Vazquez. is a 61 y.o. male is acute complaint of shortness of breath and chest pain when taking a deep breath that started over Nicholas Vazquez weekend. Symptoms have been persistent for Nicholas Vazquez past several days. Has never had these symptoms before. He does not smoke. Describes Nicholas Vazquez pain as a pressure and sharp when taking a deep breath. Has not noted any lower extremity swelling. Denies any worsening pain or shortness of breath with exertion. Went to work today and was standing for his job and started feeling worsening pain and wanted to be checked out.   Past Medical History:  Diagnosis Date  . Abnormal ultrasound of prostate November 07, 2014  . Acute back pain with sciatica    Midline Low back- Right-side  . Arthritis    Lumbosacral DDD and  Rheumatoid  . Cancer ([place])    PROSTATE  . Chronic kidney disease    H/O KIDNEY STONES  . Collagen vascular disease (HCC)    Lupus  . Diabetes mellitus without complication ([place])    Type II; pt stated it was due to steroid injections he was given  . Enlarged prostate   . GERD (gastroesophageal reflux disease)   . Glaucoma    Bilateral  . History of kidney stones   . Hypertension   . Lupus    Family History  Problem Relation Age of Onset  . Diabetes Mother   . Varicose Veins Mother   . Cancer Father    Past Surgical History:  Procedure Laterality Date  . ABDOMINAL EXPOSURE N/A 11/09/2014   Procedure: ABDOMINAL EXPOSURE;  Surgeon: Angelia Mould, MD;  Location: [place];  Service: Vascular;  Laterality: N/A;  . ANTERIOR LUMBAR FUSION N/A 11/09/2014   Procedure: ANTERIOR LUMBAR FUSION 1 LEVEL L5 - S1;  Surgeon: Nicholas Schools, MD;  Location: [place];  Service: Orthopedics;   Laterality: N/A;  . COLONOSCOPY W/ POLYPECTOMY    . FOOT SURGERY Bilateral   . GREEN LIGHT LASER TURP (TRANSURETHRAL RESECTION OF PROSTATE N/A 01/30/2015   Procedure: GREEN LIGHT LASER TURP (TRANSURETHRAL RESECTION OF PROSTATE;  Surgeon: Nicholas Cowper, MD;  Location: [facility];  Service: Urology;  Laterality: N/A;  . PROSTATE BIOPSY  October 2016  . TUMOR REMOVAL     Patient Active Problem List   Diagnosis Date Noted  . Back pain 11/09/2014      Prior to Admission medications   Medication Sig Start Date End Date Taking? Authorizing Provider  BRIMONIDINE TARTRATE OP Place 0.2 % into Nicholas Vazquez left eye 2 (two) times daily.     [provider]  diazepam (VALIUM) 10 MG tablet Take 10 mg by mouth at bedtime.    [provider]  docusate sodium (COLACE) 100 MG capsule Take 1 capsule (100 mg total) by mouth 3 (three) times daily as needed for mild constipation. 11/09/14   Nicholas Schools, MD  Dorzolamide HCl-Timolol Mal PF 22.3-6.8 MG/ML SOLN Apply 1 drop to eye 2 (two) times daily.     [provider]  HYDROcodone-acetaminophen (NORCO) 7.5-325 MG tablet Take 1-2 tablets by mouth every 4 (four) hours as needed for moderate pain. Maximum dose per 24 hours - 8 pills 01/30/15   Nicholas [facility]quez,  Nicholas Limes, MD  hydroxychloroquine (PLAQUENIL) 200 MG tablet Take 200 mg by mouth 2 (two) times daily. Sulfate 09/30/14   [provider]  latanoprost (XALATAN) 0.005 % ophthalmic solution Place 1 drop into both eyes at bedtime.     [provider]  levofloxacin (LEVAQUIN) 500 MG tablet Take 1 tablet (500 mg total) by mouth daily. 01/30/15   Nicholas Cowper, MD  meloxicam (MOBIC) 15 MG tablet Take 15 mg by mouth daily. Reported on 01/22/2015    [provider]  Methen-Hyosc-Meth Blue-Na Phos (UROGESIC-BLUE) 81.6 MG TABS Take 1 tablet (81.6 mg total) by mouth every 6 (six) hours as needed (dysuria). 01/30/15   Nicholas Cowper, MD  omeprazole (PRILOSEC) 40 MG capsule Take 40 mg  by mouth 2 (two) times daily.  09/30/14   [provider]  ondansetron (ZOFRAN ODT) 8 MG disintegrating tablet Take 1 tablet (8 mg total) by mouth every 6 (six) hours as needed for nausea or vomiting. 01/30/15   Nicholas Cowper, MD  oxyCODONE-acetaminophen (PERCOCET/ROXICET) 5-325 MG tablet Take 1 tablet by mouth every 6 (six) hours as needed for severe pain.    [provider]  polyethylene glycol powder (GLYCOLAX) powder Take 17 g by mouth daily. Patient taking differently: Take 17 g by mouth as needed.  11/09/14   Nicholas Schools, MD    Allergies Sulfa antibiotics    Social History Social History  Substance Use Topics  . Smoking status: Former Smoker    Packs/day: 0.25    Types: Cigarettes    Quit date: 01/21/1994  . Smokeless tobacco: Never Used  . Alcohol use No    Review of Systems Patient denies headaches, rhinorrhea, blurry vision, numbness, shortness of breath, chest pain, edema, cough, abdominal pain, nausea, vomiting, diarrhea, dysuria, fevers, rashes or hallucinations unless otherwise stated above in HPI. ____________________________________________   PHYSICAL EXAM:  VITAL SIGNS: Vitals:   07/14/16 1430 07/14/16 1500  BP: (!) 148/88 (!) 154/93  Pulse: 68 63  Resp: 12 16  Temp:      Constitutional: Alert and oriented. Well appearing and in no acute distress. Eyes: Conjunctivae are normal.  Head: Atraumatic. Nose: No congestion/rhinnorhea. Mouth/Throat: Mucous membranes are moist.   Neck: No stridor. Painless ROM.  Cardiovascular: Normal rate, regular rhythm. Grossly normal heart sounds.  Good peripheral circulation. Respiratory: Normal respiratory effort.  No retractions. Lungs CTAB. Gastrointestinal: Soft and nontender. No distention. No abdominal bruits. No CVA tenderness. Genitourinary:  Musculoskeletal: No lower extremity tenderness nor edema.  No joint effusions. Neurologic:  Normal speech and language. No gross focal neurologic deficits  are appreciated. No facial droop Skin:  Skin is warm, dry and intact. No rash noted. Psychiatric: Mood and affect are normal. Speech and behavior are normal.  ____________________________________________   LABS (all labs ordered are listed, but only abnormal results are displayed)  Results for orders placed or performed during Nicholas Vazquez hospital encounter of 07/14/16 (from Nicholas Vazquez past 24 hour(s))  Basic metabolic panel     Status: None   Collection Time: 07/14/16 12:25 PM  Result Value Ref Range   Sodium 140 135 - 145 mmol/L   Potassium 4.3 3.5 - 5.1 mmol/L   Chloride 107 101 - 111 mmol/L   CO2 27 22 - 32 mmol/L   Glucose, Bld 93 65 - 99 mg/dL   BUN 18 6 - 20 mg/dL   Creatinine, Ser 1.14 0.61 - 1.24 mg/dL   Calcium 9.0 8.9 - 10.3 mg/dL   GFR calc non  Af Amer >60 >60 mL/min   GFR calc Af Amer >60 >60 mL/min   Anion gap 6 5 - 15  CBC     Status: Abnormal   Collection Time: 07/14/16 12:25 PM  Result Value Ref Range   WBC 5.2 3.8 - 10.6 K/uL   RBC 3.97 (L) 4.40 - 5.90 MIL/uL   Hemoglobin 12.7 (L) 13.0 - 18.0 g/dL   HCT 36.8 (L) 40.0 - 52.0 %   MCV 92.7 80.0 - 100.0 fL   MCH 31.9 26.0 - 34.0 pg   MCHC 34.4 32.0 - 36.0 g/dL   RDW 12.9 11.5 - 14.5 %   Platelets 185 150 - 440 K/uL  Troponin I     Status: None   Collection Time: 07/14/16 12:25 PM  Result Value Ref Range   Troponin I <0.03 <0.03 ng/mL  Troponin I     Status: None   Collection Time: 07/14/16  3:09 PM  Result Value Ref Range   Troponin I <0.03 <0.03 ng/mL   ____________________________________________  EKG My review and personal interpretation at Time: 12:26   Indication: chest pain  Rate: 70  Rhythm: sinus Axis: normal  Other: normal intervals, no stemi ____________________________________________  RADIOLOGY  I personally reviewed all radiographic images ordered to evaluate for Nicholas Vazquez above acute complaints and reviewed radiology reports and findings.  These findings were personally discussed with Nicholas Vazquez patient.  Please  see medical record for radiology report.  ____________________________________________   PROCEDURES  Procedure(s) performed:  Procedures    Critical Care performed: no ____________________________________________   INITIAL IMPRESSION / ASSESSMENT AND PLAN / ED COURSE  Pertinent labs & imaging results that were available during my care of Nicholas Vazquez patient were reviewed by me and considered in my medical decision making (see chart for details).  DDX: ACS, pericarditis, esophagitis, boerhaaves, pe, dissection, pna, bronchitis, costochondritis   Urijah Arko. is a 61 y.o. who presents to [facility] ED with pleuritic chest pain as described above.  Patient is AFVSS in ED. Exam as above. Given current presentation have considered Nicholas Vazquez above differential.  Patient low risk heart score of 3. EKG and troponin negative 2 of her three-hour delta. CT imaging of Nicholas Vazquez chest ordered to evaluate for pulmonary embolism, pneumonia or intrathoracic process. CT imaging negative. Pain likely secondary to pleurisy or musculoskeletal pain. Patient no acute distress has been a symptomatically since being in [facility]quez ER. This point do feel patient is stable for follow-up with his PCP.  Have discussed with Nicholas Vazquez patient and available family all diagnostics and treatments performed thus far and all questions were answered to Nicholas Vazquez best of my ability. Nicholas Vazquez patient demonstrates understanding and agreement with plan.        ____________________________________________   FINAL CLINICAL IMPRESSION(S) / ED DIAGNOSES  Final diagnoses:  Atypical chest pain      NEW MEDICATIONS STARTED DURING THIS VISIT:  New Prescriptions   No medications on file     Note:  This document was prepared using Dragon voice recognition software and may include unintentional dictation errors.    Merlyn Lot, MD 07/14/16 [phone]

## 2016-07-14 NOTE — ED Notes (Signed)
Resumed care from [facility].  Pt alert.  nsr on monitor.  Family with pt.  Labs sent.

## 2016-07-14 NOTE — ED Notes (Signed)
Report to Gwinda Passe, RN

## 2016-10-16 ENCOUNTER — Other Ambulatory Visit: Payer: Self-pay | Admitting: Urology

## 2016-10-16 DIAGNOSIS — C61 Malignant neoplasm of prostate: Secondary | ICD-10-CM

## 2016-10-16 DIAGNOSIS — R310 Gross hematuria: Secondary | ICD-10-CM

## 2016-10-23 ENCOUNTER — Ambulatory Visit
Admission: RE | Admit: 2016-10-23 | Discharge: 2016-10-23 | Disposition: A | Discharge status: Home / Self Care | Payer: Commercial Managed Care - PPO | Source: Ambulatory Visit | Attending: Urology | Admitting: Urology

## 2016-10-23 DIAGNOSIS — N281 Cyst of kidney, acquired: Secondary | ICD-10-CM | POA: Diagnosis not present

## 2016-10-23 DIAGNOSIS — N200 Calculus of kidney: Secondary | ICD-10-CM | POA: Diagnosis not present

## 2016-10-23 DIAGNOSIS — R310 Gross hematuria: Secondary | ICD-10-CM | POA: Diagnosis present

## 2016-10-23 DIAGNOSIS — C61 Malignant neoplasm of prostate: Secondary | ICD-10-CM | POA: Diagnosis present

## 2016-10-23 LAB — POCT I-STAT CREATININE: Creatinine, Ser: 1.1 mg/dL (ref 0.61–1.24)

## 2016-10-23 MED ORDER — IOPAMIDOL (ISOVUE-300) INJECTION 61%
125.0000 mL | Freq: Once | INTRAVENOUS | Status: AC | PRN
Start: 2016-10-23 — End: 2016-10-23
  Administered 2016-10-23: 125 mL via INTRAVENOUS

## 2016-11-19 MED ORDER — LEVOFLOXACIN 500 MG PO TABS: 500.0000 mg | ORAL_TABLET | ORAL | Status: DC

## 2016-11-20 ENCOUNTER — Ambulatory Visit
Admission: RE | Admit: 2016-11-20 | Discharge: 2016-11-20 | Disposition: A | Discharge status: Home / Self Care | Payer: Commercial Managed Care - PPO | Source: Ambulatory Visit | Attending: Urology | Admitting: Urology

## 2016-11-20 ENCOUNTER — Encounter
Admission: RE | Disposition: A | Discharge status: Home / Self Care | Payer: Self-pay | Source: Ambulatory Visit | Attending: Urology

## 2016-11-20 DIAGNOSIS — Z882 Allergy status to sulfonamides status: Secondary | ICD-10-CM | POA: Diagnosis not present

## 2016-11-20 DIAGNOSIS — Z538 Procedure and treatment not carried out for other reasons: Secondary | ICD-10-CM | POA: Diagnosis not present

## 2016-11-20 DIAGNOSIS — K219 Gastro-esophageal reflux disease without esophagitis: Secondary | ICD-10-CM | POA: Diagnosis not present

## 2016-11-20 DIAGNOSIS — Z87891 Personal history of nicotine dependence: Secondary | ICD-10-CM | POA: Insufficient documentation

## 2016-11-20 DIAGNOSIS — E1139 Type 2 diabetes mellitus with other diabetic ophthalmic complication: Secondary | ICD-10-CM | POA: Insufficient documentation

## 2016-11-20 DIAGNOSIS — M329 Systemic lupus erythematosus, unspecified: Secondary | ICD-10-CM | POA: Insufficient documentation

## 2016-11-20 DIAGNOSIS — N200 Calculus of kidney: Secondary | ICD-10-CM | POA: Diagnosis present

## 2016-11-20 DIAGNOSIS — Z79899 Other long term (current) drug therapy: Secondary | ICD-10-CM | POA: Diagnosis not present

## 2016-11-20 DIAGNOSIS — H42 Glaucoma in diseases classified elsewhere: Secondary | ICD-10-CM | POA: Insufficient documentation

## 2016-11-20 HISTORY — PX: EXTRACORPOREAL SHOCK WAVE LITHOTRIPSY: SHX1557

## 2016-11-20 SURGERY — LITHOTRIPSY, ESWL
Anesthesia: Moderate Sedation | Laterality: Right

## 2016-11-20 MED ORDER — MORPHINE SULFATE (PF) 2 MG/ML IV SOLN
10.0000 mg | Freq: Once | INTRAVENOUS | Status: DC
  Filled 2016-11-20: qty 5

## 2016-11-20 MED ORDER — DIPHENHYDRAMINE HCL 25 MG PO CAPS: 25.0000 mg | ORAL_CAPSULE | ORAL | Status: DC

## 2016-11-20 MED ORDER — DEXTROSE-NACL 5-0.45 % IV SOLN: INTRAVENOUS | Status: DC

## 2016-11-20 MED ORDER — MIDAZOLAM HCL 2 MG/2ML IJ SOLN: 1.0000 mg | Freq: Once | INTRAMUSCULAR | Status: DC

## 2016-11-20 MED ORDER — PROMETHAZINE HCL 25 MG/ML IJ SOLN: 25.0000 mg | Freq: Once | INTRAMUSCULAR | Status: DC

## 2016-11-20 NOTE — OR Nursing (Signed)
Patient report taking Etodolac last evening. Dr Yves Dill notified procedure canceled patient instructed to go to Dr [facility] office now to reschedule per dr Yves Dill.

## 2016-11-21 ENCOUNTER — Encounter: Payer: Self-pay | Admitting: Urology

## 2016-12-03 MED ORDER — LEVOFLOXACIN 500 MG PO TABS: 500.0000 mg | ORAL_TABLET | Freq: Once | ORAL | Status: DC

## 2016-12-04 ENCOUNTER — Other Ambulatory Visit: Payer: Self-pay

## 2016-12-04 ENCOUNTER — Encounter: Payer: Self-pay | Admitting: *Deleted

## 2016-12-04 ENCOUNTER — Ambulatory Visit
Admission: RE | Admit: 2016-12-04 | Discharge: 2016-12-04 | Disposition: A | Discharge status: Home / Self Care | Payer: Commercial Managed Care - PPO | Source: Ambulatory Visit | Attending: Urology | Admitting: Urology

## 2016-12-04 ENCOUNTER — Encounter
Admission: RE | Disposition: A | Discharge status: Home / Self Care | Payer: Self-pay | Source: Ambulatory Visit | Attending: Urology

## 2016-12-04 DIAGNOSIS — Z8546 Personal history of malignant neoplasm of prostate: Secondary | ICD-10-CM | POA: Diagnosis not present

## 2016-12-04 DIAGNOSIS — H409 Unspecified glaucoma: Secondary | ICD-10-CM | POA: Diagnosis not present

## 2016-12-04 DIAGNOSIS — K219 Gastro-esophageal reflux disease without esophagitis: Secondary | ICD-10-CM | POA: Diagnosis not present

## 2016-12-04 DIAGNOSIS — Z87891 Personal history of nicotine dependence: Secondary | ICD-10-CM | POA: Insufficient documentation

## 2016-12-04 DIAGNOSIS — M1990 Unspecified osteoarthritis, unspecified site: Secondary | ICD-10-CM | POA: Insufficient documentation

## 2016-12-04 DIAGNOSIS — Z791 Long term (current) use of non-steroidal anti-inflammatories (NSAID): Secondary | ICD-10-CM | POA: Insufficient documentation

## 2016-12-04 DIAGNOSIS — M329 Systemic lupus erythematosus, unspecified: Secondary | ICD-10-CM | POA: Insufficient documentation

## 2016-12-04 DIAGNOSIS — Z882 Allergy status to sulfonamides status: Secondary | ICD-10-CM | POA: Diagnosis not present

## 2016-12-04 DIAGNOSIS — Z79899 Other long term (current) drug therapy: Secondary | ICD-10-CM | POA: Diagnosis not present

## 2016-12-04 DIAGNOSIS — I209 Angina pectoris, unspecified: Secondary | ICD-10-CM | POA: Insufficient documentation

## 2016-12-04 DIAGNOSIS — E119 Type 2 diabetes mellitus without complications: Secondary | ICD-10-CM | POA: Diagnosis not present

## 2016-12-04 DIAGNOSIS — I119 Hypertensive heart disease without heart failure: Secondary | ICD-10-CM | POA: Insufficient documentation

## 2016-12-04 DIAGNOSIS — N200 Calculus of kidney: Secondary | ICD-10-CM | POA: Diagnosis present

## 2016-12-04 HISTORY — PX: EXTRACORPOREAL SHOCK WAVE LITHOTRIPSY: SHX1557

## 2016-12-04 LAB — GLUCOSE, CAPILLARY: Glucose-Capillary: 88 mg/dL (ref 65–99)

## 2016-12-04 SURGERY — LITHOTRIPSY, ESWL
Anesthesia: Moderate Sedation | Laterality: Right

## 2016-12-04 MED ORDER — LEVOFLOXACIN 500 MG PO TABS
ORAL_TABLET | ORAL | Status: AC
  Filled 2016-12-04: qty 1

## 2016-12-04 MED ORDER — NUCYNTA 50 MG PO TABS: 50.0000 mg | ORAL_TABLET | Freq: Four times a day (QID) | ORAL | 0 refills | Status: DC | PRN

## 2016-12-04 MED ORDER — PROMETHAZINE HCL 25 MG/ML IJ SOLN
INTRAMUSCULAR | Status: AC
  Filled 2016-12-04: qty 1

## 2016-12-04 MED ORDER — FUROSEMIDE 10 MG/ML IJ SOLN
10.0000 mg | Freq: Once | INTRAMUSCULAR | Status: AC
  Administered 2016-12-04: 10 mg via INTRAVENOUS

## 2016-12-04 MED ORDER — DEXTROSE-NACL 5-0.45 % IV SOLN: INTRAVENOUS | Status: DC

## 2016-12-04 MED ORDER — FUROSEMIDE 10 MG/ML IJ SOLN
INTRAMUSCULAR | Status: AC
  Administered 2016-12-04: 10 mg via INTRAVENOUS
  Filled 2016-12-04: qty 2

## 2016-12-04 MED ORDER — ONDANSETRON 8 MG PO TBDP: 8.0000 mg | ORAL_TABLET | Freq: Four times a day (QID) | ORAL | 3 refills | Status: DC | PRN

## 2016-12-04 MED ORDER — TAMSULOSIN HCL 0.4 MG PO CAPS: 0.4000 mg | ORAL_CAPSULE | Freq: Every day | ORAL | 0 refills | Status: DC

## 2016-12-04 MED ORDER — MIDAZOLAM HCL 2 MG/2ML IJ SOLN
1.0000 mg | Freq: Once | INTRAMUSCULAR | Status: AC
  Administered 2016-12-04: 1 mg via INTRAMUSCULAR

## 2016-12-04 MED ORDER — MIDAZOLAM HCL 2 MG/2ML IJ SOLN
INTRAMUSCULAR | Status: AC
  Filled 2016-12-04: qty 2

## 2016-12-04 MED ORDER — DIPHENHYDRAMINE HCL 25 MG PO CAPS
ORAL_CAPSULE | ORAL | Status: AC
  Filled 2016-12-04: qty 1

## 2016-12-04 MED ORDER — DIPHENHYDRAMINE HCL 25 MG PO CAPS
25.0000 mg | ORAL_CAPSULE | ORAL | Status: AC
  Administered 2016-12-04: 25 mg via ORAL

## 2016-12-04 MED ORDER — MORPHINE SULFATE (PF) 10 MG/ML IV SOLN
10.0000 mg | Freq: Once | INTRAVENOUS | Status: AC
  Administered 2016-12-04: 10 mg via INTRAMUSCULAR

## 2016-12-04 MED ORDER — MORPHINE SULFATE (PF) 10 MG/ML IV SOLN
INTRAVENOUS | Status: AC
  Filled 2016-12-04: qty 1

## 2016-12-04 MED ORDER — PROMETHAZINE HCL 25 MG/ML IJ SOLN
25.0000 mg | Freq: Once | INTRAMUSCULAR | Status: AC
  Administered 2016-12-04: 25 mg via INTRAMUSCULAR

## 2016-12-04 MED ORDER — ONDANSETRON 8 MG PO TBDP: 4.0000 mg | ORAL_TABLET | Freq: Four times a day (QID) | ORAL | 3 refills | Status: DC | PRN

## 2016-12-04 NOTE — OR Nursing (Signed)
12:55 Iv startx 4 sticks.  unsuccessful Henrine Screws RN in left and right forearm.  Jeralyn Ruths RN in left forearm and successful in right AC.

## 2016-12-04 NOTE — Discharge Instructions (Addendum)
Lithotripsy, Care After °This sheet gives you information about how to care for yourself after your procedure. Your health care provider may also give you more specific instructions. If you have problems or questions, contact your health care provider. °What can I expect after the procedure? °After the procedure, it is common to have: °· Some blood in your urine. This should only last for a few days. °· Soreness in your back, sides, or upper abdomen for a few days. °· Blotches or bruises on your back where the pressure wave entered the skin. °· Pain, discomfort, or nausea when pieces (fragments) of the kidney stone move through the tube that carries urine from the kidney to the bladder (ureter). Stone fragments may pass soon after the procedure, but they may continue to pass for up to 4-8 weeks. °? If you have severe pain or nausea, contact your health care provider. This may be caused by a large stone that was not broken up, and this may mean that you need more treatment. °· Some pain or discomfort during urination. °· Some pain or discomfort in the lower abdomen or (in men) at the base of the penis. ° °Follow these instructions at home: °Medicines °· Take over-the-counter and prescription medicines only as told by your health care provider. °· If you were prescribed an antibiotic medicine, take it as told by your health care provider. Do not stop taking the antibiotic even if you start to feel better. °· Do not drive for 24 hours if you were given a medicine to help you relax (sedative). °· Do not drive or use heavy machinery while taking prescription pain medicine. °Eating and drinking °· Drink enough water and fluids to keep your urine clear or pale yellow. This helps any remaining pieces of the stone to pass. It can also help prevent new stones from forming. °· Eat plenty of fresh fruits and vegetables. °· Follow instructions from your health care provider about eating and drinking restrictions. You may be  instructed: °? To reduce how much salt (sodium) you eat or drink. Check ingredients and nutrition facts on packaged foods and beverages. °? To reduce how much meat you eat. °· Eat the recommended amount of calcium for your age and gender. Ask your health care provider how much calcium you should have. °General instructions °· Get plenty of rest. °· Most people can resume normal activities 1-2 days after the procedure. Ask your health care provider what activities are safe for you. °· If directed, strain all urine through the strainer that was provided by your health care provider. °? Keep all fragments for your health care provider to see. Any stones that are found may be sent to a medical lab for examination. The stone may be as small as a grain of salt. °· Keep all follow-up visits as told by your health care provider. This is important. °Contact a health care provider if: °· You have pain that is severe or does not get better with medicine. °· You have nausea that is severe or does not go away. °· You have blood in your urine longer than your health care provider told you to expect. °· You have more blood in your urine. °· You have pain during urination that does not go away. °· You urinate more frequently than usual and this does not go away. °· You develop a rash or any other possible signs of an allergic reaction. °Get help right away if: °· You have severe pain in   your back, sides, or upper abdomen.  You have severe pain while urinating.  Your urine is very dark red.  You have blood in your stool (feces).  You cannot pass any urine at all.  You feel a strong urge to urinate after emptying your bladder.  You have a fever or chills.  You develop shortness of breath, difficulty breathing, or chest pain.  You have severe nausea that leads to persistent vomiting.  You faint. Summary  After this procedure, it is common to have some pain, discomfort, or nausea when pieces (fragments) of the  kidney stone move through the tube that carries urine from the kidney to the bladder (ureter). If this pain or nausea is severe, however, you should contact your health care provider.  Most people can resume normal activities 1-2 days after the procedure. Ask your health care provider what activities are safe for you.  Drink enough water and fluids to keep your urine clear or pale yellow. This helps any remaining pieces of the stone to pass, and it can help prevent new stones from forming.  If directed, strain your urine and keep all fragments for your health care provider to see. Fragments or stones may be as small as a grain of salt.  Get help right away if you have severe pain in your back, sides, or upper abdomen or have severe pain while urinating. This information is not intended to replace advice given to you by your health care provider. Make sure you discuss any questions you have with your health care provider. Document Released: 01/12/2007 Document Revised: 11/14/2015 Document Reviewed: 11/14/2015 Elsevier Interactive Patient Education  2017 [place]   1) The drugs that you were given will stay in your system until tomorrow so for the next 24 hours you should not:  A) Drive an automobile B) Make any legal decisions C) Drink any alcoholic beverage   2) You may resume regular meals tomorrow.  Today it is better to start with liquids and gradually work up to solid foods.  You may eat anything you prefer, but it is better to start with liquids, then soup and crackers, and gradually work up to solid foods.   3) Please notify your doctor immediately if you have any unusual bleeding, trouble breathing, redness and pain at the surgery site, drainage, fever, or pain not relieved by medication.    4) Additional Instructions:        Please contact your physician with any problems or Same Day Surgery at [phone],  Monday through Friday 6 am to 4 pm, or [place] at [facility] number at [phone].

## 2016-12-05 ENCOUNTER — Encounter: Payer: Self-pay | Admitting: Urology

## 2016-12-30 ENCOUNTER — Emergency Department
Admission: EM | Admit: 2016-12-30 | Discharge: 2016-12-30 | Disposition: A | Discharge status: Home / Self Care | Payer: Commercial Managed Care - PPO | Attending: Emergency Medicine | Admitting: Emergency Medicine

## 2016-12-30 ENCOUNTER — Encounter: Payer: Self-pay | Admitting: Emergency Medicine

## 2016-12-30 ENCOUNTER — Emergency Department: Payer: Commercial Managed Care - PPO

## 2016-12-30 DIAGNOSIS — I10 Essential (primary) hypertension: Secondary | ICD-10-CM | POA: Diagnosis not present

## 2016-12-30 DIAGNOSIS — Z87891 Personal history of nicotine dependence: Secondary | ICD-10-CM | POA: Diagnosis not present

## 2016-12-30 DIAGNOSIS — R319 Hematuria, unspecified: Secondary | ICD-10-CM | POA: Diagnosis not present

## 2016-12-30 DIAGNOSIS — R109 Unspecified abdominal pain: Secondary | ICD-10-CM

## 2016-12-30 DIAGNOSIS — Z8546 Personal history of malignant neoplasm of prostate: Secondary | ICD-10-CM | POA: Insufficient documentation

## 2016-12-30 DIAGNOSIS — Z79899 Other long term (current) drug therapy: Secondary | ICD-10-CM | POA: Insufficient documentation

## 2016-12-30 DIAGNOSIS — E119 Type 2 diabetes mellitus without complications: Secondary | ICD-10-CM | POA: Insufficient documentation

## 2016-12-30 LAB — URINALYSIS, COMPLETE (UACMP) WITH MICROSCOPIC
Bacteria, UA: NONE SEEN
Bilirubin Urine: NEGATIVE
Glucose, UA: NEGATIVE mg/dL
Ketones, ur: NEGATIVE mg/dL
Leukocytes, UA: NEGATIVE
Nitrite: NEGATIVE
Protein, ur: 100 mg/dL — AB
Specific Gravity, Urine: 1.019 (ref 1.005–1.030)
WBC, UA: NONE SEEN WBC/hpf (ref 0–5)
pH: 6 (ref 5.0–8.0)

## 2016-12-30 LAB — CBC WITH DIFFERENTIAL/PLATELET
Basophils Absolute: 0 10*3/uL (ref 0–0.1)
Basophils Relative: 1 %
Eosinophils Absolute: 0.1 10*3/uL (ref 0–0.7)
Eosinophils Relative: 2 %
HCT: 37 % — ABNORMAL LOW (ref 40.0–52.0)
Hemoglobin: 12.7 g/dL — ABNORMAL LOW (ref 13.0–18.0)
Lymphocytes Relative: 17 %
Lymphs Abs: 0.7 10*3/uL — ABNORMAL LOW (ref 1.0–3.6)
MCH: 32.5 pg (ref 26.0–34.0)
MCHC: 34.3 g/dL (ref 32.0–36.0)
MCV: 94.8 fL (ref 80.0–100.0)
Monocytes Absolute: 0.4 10*3/uL (ref 0.2–1.0)
Monocytes Relative: 10 %
Neutro Abs: 3 10*3/uL (ref 1.4–6.5)
Neutrophils Relative %: 70 %
Platelets: 193 10*3/uL (ref 150–440)
RBC: 3.91 MIL/uL — ABNORMAL LOW (ref 4.40–5.90)
RDW: 13.1 % (ref 11.5–14.5)
WBC: 4.3 10*3/uL (ref 3.8–10.6)

## 2016-12-30 LAB — BASIC METABOLIC PANEL
Anion gap: 5 (ref 5–15)
BUN: 15 mg/dL (ref 6–20)
CO2: 25 mmol/L (ref 22–32)
Calcium: 8.7 mg/dL — ABNORMAL LOW (ref 8.9–10.3)
Chloride: 109 mmol/L (ref 101–111)
Creatinine, Ser: 1.1 mg/dL (ref 0.61–1.24)
GFR calc Af Amer: >60 mL/min (ref >60)
GFR calc non Af Amer: >60 mL/min (ref >60)
Glucose, Bld: 139 mg/dL — ABNORMAL HIGH (ref 65–99)
Potassium: 4.4 mmol/L (ref 3.5–5.1)
Sodium: 139 mmol/L (ref 135–145)

## 2016-12-30 NOTE — ED Triage Notes (Signed)
Patient presents to ED via POV from home with c/o hematuria x 2 days. Hx of prostate cancer and kidney stones. Patient states he had a lithotripsy 3 weeks ago. Patient states his urine has been clear since but recently has turns red/pink.

## 2016-12-30 NOTE — ED Notes (Signed)
Pt verbalized understanding of discharge instructions. NAD at this time. 

## 2016-12-30 NOTE — ED Provider Notes (Signed)
[facility] Emergency Department Provider Note  ____________________________________________   First MD Initiated Contact with Patient 12/30/16 [phone]     (approximate)  I have reviewed the triage vital signs and the nursing notes.   HISTORY  Chief Complaint Hematuria   HPI Nicholas Vazquez. is a 61 y.o. male with a history of kidney stones as well as prostate cancer who is presenting to the emergency department today with hematuria.  He says that he has been having red to pink urine with fingertip-sized blood clots since yesterday.  He denies any abdominal pain.  Says that he had a lithotripsy several weeks ago for stone to the right flank and that he is still having pressure to the right flank at this time.  Says that he has had blood in his urine in the past that was thought to be secondary to kidney stones.  He is seeing Dr. Eliberto Ivory for urology who has done a cystoscopy in the past for hematuria.  The patient is unsure of what exactly the source of the hematuria has been in the past beyond kidney stones.  Patient also with a history of prostate cancer that was treated with radioactive seeds.   Past Medical History:  Diagnosis Date  . Abnormal ultrasound of prostate November 07, 2014  . Acute back pain with sciatica    Midline Low back- Right-side  . Arthritis    Lumbosacral DDD and  Rheumatoid  . Cancer ([place])    PROSTATE  . Chronic kidney disease    H/O KIDNEY STONES  . Collagen vascular disease (HCC)    Lupus  . Diabetes mellitus without complication ([place])    Type II; pt stated it was due to steroid injections he was given  . Enlarged prostate   . GERD (gastroesophageal reflux disease)   . Glaucoma    Bilateral  . History of kidney stones   . Hypertension   . Lupus     Patient Active Problem List   Diagnosis Date Noted  . Back pain 11/09/2014    Past Surgical History:  Procedure Laterality Date  . ABDOMINAL EXPOSURE N/A 11/09/2014   Procedure: ABDOMINAL EXPOSURE;  Surgeon: Angelia Mould, MD;  Location: [place];  Service: Vascular;  Laterality: N/A;  . ANTERIOR LUMBAR FUSION N/A 11/09/2014   Procedure: ANTERIOR LUMBAR FUSION 1 LEVEL L5 - S1;  Surgeon: Melina Schools, MD;  Location: [place];  Service: Orthopedics;  Laterality: N/A;  . COLONOSCOPY W/ POLYPECTOMY    . EXTRACORPOREAL SHOCK WAVE LITHOTRIPSY Right 11/20/2016   Procedure: EXTRACORPOREAL SHOCK WAVE LITHOTRIPSY (ESWL);  Surgeon: Royston Cowper, MD;  Location: [facility];  Service: Urology;  Laterality: Right;  . EXTRACORPOREAL SHOCK WAVE LITHOTRIPSY Right 12/04/2016   Procedure: EXTRACORPOREAL SHOCK WAVE LITHOTRIPSY (ESWL);  Surgeon: Royston Cowper, MD;  Location: [facility];  Service: Urology;  Laterality: Right;  . FOOT SURGERY Bilateral   . GREEN LIGHT LASER TURP (TRANSURETHRAL RESECTION OF PROSTATE N/A 01/30/2015   Procedure: GREEN LIGHT LASER TURP (TRANSURETHRAL RESECTION OF PROSTATE;  Surgeon: Royston Cowper, MD;  Location: [facility];  Service: Urology;  Laterality: N/A;  . PROSTATE BIOPSY  October 2016  . TUMOR REMOVAL      Prior to Admission medications   Medication Sig Start Date End Date Taking? Authorizing Provider  amLODipine (NORVASC) 5 MG tablet Take 5 mg daily by mouth.    [provider]  BRIMONIDINE TARTRATE OP Place 0.2 % into the left eye 2 (  two) times daily.     [provider]  diazepam (VALIUM) 10 MG tablet Take 10 mg by mouth at bedtime.    [provider]  docusate sodium (COLACE) 100 MG capsule Take 1 capsule (100 mg total) by mouth 3 (three) times daily as needed for mild constipation. Patient not taking: Reported on 11/20/2016 11/09/14   Melina Schools, MD  Dorzolamide HCl-Timolol Mal PF 22.3-6.8 MG/ML SOLN Apply 1 drop to eye 2 (two) times daily.     [provider]  etodolac (LODINE) 500 MG tablet Take 500 mg 2 (two) times daily by mouth.    [provider]  gabapentin (NEURONTIN) 100 MG  capsule Take 100 mg 3 (three) times daily by mouth.    [provider]  HYDROcodone-acetaminophen (NORCO) 7.5-325 MG tablet Take 1-2 tablets by mouth every 4 (four) hours as needed for moderate pain. Maximum dose per 24 hours - 8 pills 01/30/15   Royston Cowper, MD  hydroxychloroquine (PLAQUENIL) 200 MG tablet Take 200 mg by mouth 2 (two) times daily. Sulfate 09/30/14   [provider]  lansoprazole (PREVACID) 30 MG capsule Take 30 mg daily at 12 noon by mouth.    [provider]  latanoprost (XALATAN) 0.005 % ophthalmic solution Place 1 drop into both eyes at bedtime.     [provider]  meloxicam (MOBIC) 15 MG tablet Take 15 mg by mouth daily. Reported on 01/22/2015    [provider]  NUCYNTA 50 MG tablet Take 1 tablet (50 mg total) by mouth every 6 (six) hours as needed. 1 TO 2 TABS Q 6 HOURS PRN PAIN 12/04/16   Royston Cowper, MD  omeprazole (PRILOSEC) 40 MG capsule Take 40 mg by mouth 2 (two) times daily.  09/30/14   [provider]  ondansetron (ZOFRAN ODT) 8 MG disintegrating tablet Take 1 tablet (8 mg total) by mouth every 6 (six) hours as needed for nausea or vomiting. Patient not taking: Reported on 11/20/2016 01/30/15   Royston Cowper, MD  ondansetron (ZOFRAN ODT) 8 MG disintegrating tablet Take 1 tablet (8 mg total) by mouth every 6 (six) hours as needed. 12/04/16   Royston Cowper, MD  ondansetron (ZOFRAN ODT) 8 MG disintegrating tablet Take 0.5 tablets (4 mg total) by mouth every 6 (six) hours as needed. 12/04/16   Royston Cowper, MD  oxyCODONE-acetaminophen (PERCOCET/ROXICET) 5-325 MG tablet Take 1 tablet by mouth every 6 (six) hours as needed for severe pain.    [provider]  tamsulosin (FLOMAX) 0.4 MG CAPS capsule Take 1 capsule (0.4 mg total) by mouth daily. 12/04/16   Royston Cowper, MD    Allergies Sulfa antibiotics  Family History  Problem Relation Age of Onset  . Diabetes Mother   . Varicose Veins  Mother   . Cancer Father     Social History Social History   Tobacco Use  . Smoking status: Former Smoker    Packs/day: 0.25    Types: Cigarettes    Last attempt to quit: 01/21/1994    Years since quitting: 22.9  . Smokeless tobacco: Never Used  Substance Use Topics  . Alcohol use: No    Alcohol/week: 0.0 oz  . Drug use: No    Review of Systems  Constitutional: No fever/chills Eyes: No visual changes. ENT: No sore throat. Cardiovascular: Denies chest pain. Respiratory: Denies shortness of breath. Gastrointestinal: No abdominal pain.  No nausea, no vomiting.  No diarrhea.  No constipation. Genitourinary: As  above  musculoskeletal: Positive for right flank pain Skin: Negative for rash. Neurological: Negative for headaches, focal weakness or numbness.   ____________________________________________   PHYSICAL EXAM:  VITAL SIGNS: ED Triage Vitals  Enc Vitals Group     BP 12/30/16 0708 (!) 136/91     Pulse Rate 12/30/16 0708 76     Resp 12/30/16 0708 15     Temp 12/30/16 0708 98.2 F (36.8 C)     Temp Source 12/30/16 0708 Oral     SpO2 12/30/16 0708 100 %     Weight 12/30/16 0709 220 lb (99.8 kg)     Height 12/30/16 0709 5\' 11"  (1.803 m)     Head Circumference --      Peak Flow --      Pain Score --      Pain Loc --      Pain Edu? --      Excl. in [place]? --     Constitutional: Alert and oriented. Well appearing and in no acute distress. Eyes: Conjunctivae are normal.  Head: Atraumatic. Nose: No congestion/rhinnorhea. Mouth/Throat: Mucous membranes are moist.  Neck: No stridor.   Cardiovascular: Normal rate, regular rhythm. Grossly normal heart sounds.   Respiratory: Normal respiratory effort.  No retractions. Lungs CTAB. Gastrointestinal: Soft and nontender. No distention. No CVA tenderness. Musculoskeletal: No lower extremity tenderness nor edema.  No joint effusions. Neurologic:  Normal speech and language. No gross focal neurologic deficits are  appreciated. Skin:  Skin is warm, dry and intact. No rash noted. Psychiatric: Mood and affect are normal. Speech and behavior are normal.  ____________________________________________   LABS (all labs ordered are listed, but only abnormal results are displayed)  Labs Reviewed  CBC WITH DIFFERENTIAL/PLATELET - Abnormal; Notable for the following components:      Result Value   RBC 3.91 (*)    Hemoglobin 12.7 (*)    HCT 37.0 (*)    Lymphs Abs 0.7 (*)    All other components within normal limits  BASIC METABOLIC PANEL - Abnormal; Notable for the following components:   Glucose, Bld 139 (*)    Calcium 8.7 (*)    All other components within normal limits  URINALYSIS, COMPLETE (UACMP) WITH MICROSCOPIC - Abnormal; Notable for the following components:   Color, Urine YELLOW (*)    APPearance CLOUDY (*)    Hgb urine dipstick LARGE (*)    Protein, ur 100 (*)    Squamous Epithelial / LPF 0-5 (*)    All other components within normal limits   ____________________________________________  EKG   ____________________________________________  RADIOLOGY  Decompressed bladder.  Punctate stones in the right kidney.  Nonobstructing. ____________________________________________   PROCEDURES  Procedure(s) performed:   Procedures  Critical Care performed:   ____________________________________________   INITIAL IMPRESSION / ASSESSMENT AND PLAN / ED COURSE  Pertinent labs & imaging results that were available during my care of the patient were reviewed by me and considered in my medical decision making (see chart for details).  DDX: Right flank pain, hematuria, bladder mass, recurrent prostate cancer, kidney stone, urinary retention, UTI, renal hematoma As part of my medical decision making, I reviewed the following data within the [place] Notes from prior ED visits  ----------------------------------------- 8:44 AM on  12/30/2016 -----------------------------------------  Patient continues to appear well and without distress.  Very reassuring workup including labs and imaging.  No signs of UTI.  23 cc on bladder scan.  Patient will need to follow-up with his urologist,  Dr. Eliberto Ivory.  I discussed the lab as well as imaging results with the patient and the need for follow-up as well as return precautions including any worsening or concerning symptoms especially, pain and fever.  We also discussed urinary retention symptoms and to return immediately if any of these occur.  The patient is understanding of the plan willing to comply.     ____________________________________________   FINAL CLINICAL IMPRESSION(S) / ED DIAGNOSES  Hematuria    NEW MEDICATIONS STARTED DURING THIS VISIT:  This SmartLink is deprecated. Use AVSMEDLIST instead to display the medication list for a patient.   Note:  This document was prepared using Dragon voice recognition software and may include unintentional dictation errors.     Orbie Pyo, MD 12/30/16 [phone]

## 2016-12-30 NOTE — ED Notes (Signed)
Patient transported to X-ray 

## 2017-01-13 ENCOUNTER — Encounter: Payer: Self-pay | Admitting: Podiatry

## 2017-01-13 DIAGNOSIS — M1990 Unspecified osteoarthritis, unspecified site: Secondary | ICD-10-CM | POA: Insufficient documentation

## 2017-01-19 NOTE — Progress Notes (Signed)
This encounter was created in error - please disregard.

## 2017-02-05 NOTE — H&P (Signed)
NAME:  RAYQUON, USELMAN NO.:  [id number]  MEDICAL RECORD NO.:  [id number]  LOCATION:  ED06A                        FACILITY:  [facility]  PHYSICIAN:  Maryan Puls          DATE OF BIRTH:  1955-10-12  DATE OF ADMISSION:  12/30/2016 DATE OF DISCHARGE:                            HISTORY AND PHYSICAL   Same-day surgery, February 19th.  CHIEF COMPLAINT:  Mr. Lusty is a 62 year old African American male presented with gross hematuria.  Evaluation with CT scan revealed a 1-2 mm right lower pole renal calculus.  Cystoscopy revealed a 3 mm papillary tumor at the 10 o'clock position of the prostatic urethra. The patient comes in now for transurethral resection of bladder tumor.  PAST MEDICAL HISTORY:  ALLERGIES:  THE PATIENT IS ALLERGIC TO SULFA DRUGS.  CURRENT MEDICATIONS:  Meloxicam, hydroxychloroquine, naproxen, omeprazole, latanoprost, dorzolamide, and timolol.  SURGICAL HISTORY: 1. Photovaporization of the prostate with GreenLight laser in 2017. 2. Brachytherapy of the prostate for prostate cancer in 2017. 3. Lithotripsy in October 2018.  SOCIAL HISTORY:  The patient quit smoking in 1970s with a 5-pack year history.  He denied alcohol use.  FAMILY HISTORY:  Negative for urological disease.  PAST AND CURRENT MEDICAL CONDITIONS: 1. GERD. 2. Diabetes. 3. Glaucoma. 4. Erectile dysfunction. 5. Prostate cancer. 6. Lupus arthritis. 7. Hypogonadism.  REVIEW OF SYSTEMS:  The patient denies chest pain, shortness of breath, hypertension, or heart disease.  PHYSICAL EXAMINATION:  GENERAL:  Well-nourished African American male in no distress. HEENT:  Sclerae were clear. NECK:  Supple.  No palpable cervical adenopathy. LUNGS:  Clear to auscultation. CARDIOVASCULAR:  Regular rhythm and rate without audible murmurs. ABDOMEN:  Soft and nontender abdomen. GU:  Circumcised.  Testes smooth, nontender. RECTAL:  30 g flat, smooth, and nontender  prostate. NEUROMUSCULAR:  Alert and oriented x3.  IMPRESSION:  Bladder neck tumor.  PLAN:  Transurethral resection of bladder tumor.          ______________________________ Maryan Puls     MW/MEDQ  D:  02/05/2017  T:  02/05/2017  Job:  [id number]

## 2017-02-17 ENCOUNTER — Other Ambulatory Visit: Payer: Self-pay

## 2017-02-17 ENCOUNTER — Encounter
Admission: RE | Admit: 2017-02-17 | Discharge: 2017-02-17 | Disposition: A | Discharge status: Home / Self Care | Payer: Commercial Managed Care - PPO | Source: Ambulatory Visit | Attending: Urology | Admitting: Urology

## 2017-02-17 NOTE — Patient Instructions (Signed)
Your procedure is scheduled on: 02-24-17 TUESDAY Report to Same Day Surgery 2nd floor medical mall [facility] Entrance-take elevator on left to 2nd floor.  Check in with surgery information desk.) To find out your arrival time please call [phone] between 1PM - 3PM on 02-23-17 MONDAY  Remember: Instructions that are not followed completely may result in serious medical risk, up to and including death, or upon the discretion of your surgeon and anesthesiologist your surgery may need to be rescheduled.    _x___ 1. Do not eat food after midnight the night before your procedure. NO GUM OR CANDY AFTER MIDNIGHT.  You may drink clear liquids up to 2 hours before you are scheduled to arrive at the hospital for your procedure.  Do not drink clear liquids within 2 hours of your scheduled arrival to the hospital.  Clear liquids include  --Water or Apple juice without pulp  --Clear carbohydrate beverage such as ClearFast or Gatorade  --Black Coffee or Clear Tea (No milk, no creamers, do not add anything to the coffee or Tea     __x__ 2. No Alcohol for 24 hours before or after surgery.   __x__3. No Smoking or e-cigarettes for 24 prior to surgery.  Do not use any chewable tobacco products for at least 6 hour prior to surgery   ____  4. Bring all medications with you on the day of surgery if instructed.    __x__ 5. Notify your doctor if there is any change in your medical condition     (cold, fever, infections).    x___6. On the morning of surgery brush your teeth with toothpaste and water.  You may rinse your mouth with mouth wash if you wish.  Do not swallow any toothpaste or mouthwash.   Do not wear jewelry, make-up, hairpins, clips or nail polish.  Do not wear lotions, powders, or perfumes. You may wear deodorant.  Do not shave 48 hours prior to surgery. Men may shave face and neck.  Do not bring valuables to the hospital.    [facility] is not responsible for any belongings or  valuables.               Contacts, dentures or bridgework may not be worn into surgery.  Leave your suitcase in the car. After surgery it may be brought to your room.  For patients admitted to the hospital, discharge time is determined by your treatment team.  _  Patients discharged the day of surgery will not be allowed to drive home.  You will need someone to drive you home and stay with you the night of your procedure.     _x___ TAKE THE FOLLOWING MEDICATIONS THE MORNING OF SURGERY WITH A SMALL SIP OF WATER. These include:  1. LANSOPRAZOLE  2. TAKE A LANSOPRAZOLE Monday NIGHT BEFORE BED  3.  4.  5.  6.  ____Fleets enema or Magnesium Citrate as directed.   ____ Use CHG Soap or sage wipes as directed on instruction sheet   ____ Use inhalers on the day of surgery and bring to hospital day of surgery  ____ Stop Metformin and Janumet 2 days prior to surgery.    ____ Take 1/2 of usual insulin dose the night before surgery and none on the morning surgery.   ____ Follow recommendations from Cardiologist, Pulmonologist or PCP regarding stopping Aspirin, Coumadin, Plavix ,Eliquis, Effient, or Pradaxa, and Pletal.  X____Stop Anti-inflammatories such as Advil, Aleve, Ibuprofen, Motrin, Naproxen,ETODOLAC, Naprosyn, Goodies powders or aspirin  products NOW-OK to take Tylenol   ____ Stop supplements until after surgery.    ____ Bring C-Pap to the hospital.

## 2017-02-17 NOTE — Pre-Procedure Instructions (Signed)
NM myocardial perfusion SPECT multiple (stress and rest)10/31/2016 [place] Result Impression   Benign myocardial perfusion scan no evidence of stress-induced  myocardial ischemia ejection fraction between 45-50% there is gut uptake  artifact but there is no clear evidence of ischemia recommend conservative  medical therapy  Other Result Information  This result has an attachment that is not available.  Result Narrative  CARDIOLOGY DEPARTMENT [facility] [address] [place], [id number] [phone]  Procedure: Pharmacologic Myocardial Perfusion Imaging ONE day procedure  Indication: Angina at rest (CMS-HCC) Plan: NM myocardial perfusion SPECT multiple (stress  and rest), ECG stress test only  Ordering Physician:   Dr. Lujean Amel   Clinical History: 62 y.o. year old male recent anginal symptoms Vitals: Height: 71 in Weight: 220 lb Cardiac risk factors include:  Diabetes    Procedure:  Pharmacologic stress testing was performed with Regadenoson using a single  use 0.4mg /21ml (0.08 mg/ml) prefilled syringe intravenously infused as a  bolus dose. The stress test was stopped due to Infusion completion.Blood  pressure response was normal. The patient did not develop any symptoms  other than fatigue during the procedure.   Rest HR: 61bpm Rest BP: 122/66mmHg Max HR: 95bpm Min BP: 130/32mmHg Stress Test Administered by: Oswald Hillock, CMA ECG Interpretation: Rest GUY:QIHKVQ sinus rhythm, none Stress QVZ:DGLOVF sinus rhythm,  Recovery IEP:PIRJJO sinus rhythm ECG Interpretation:non-diagnostic due to pharmacologic testing.   Administrations This Visit  regadenoson (LEXISCAN) 0.4 mg/5 mL inj syringe 0.4 mg  Admin Date 10/29/2016 Action Given Dose 0.4 mg Route Intravenous Administered By Elvera Bicker, CNMT     technetium Tc35m sestamibi (CARDIOLITE) injection 84.16  millicurie  Admin Date 60/63/0160 Action Given Dose 10.93 millicurie Route Intravenous Administered By Elvera Bicker, CNMT     technetium Tc80m sestamibi (CARDIOLITE) injection 23.55 millicurie  Admin Date 73/22/0254 Action Given Dose 27.06 millicurie Route Intravenous Administered By Herbert Seta, CNMT       Gated post-stress perfusion imaging was performed 30 minutes after stress.  Rest images were performed 30 minutes after injection.  Gated LV Analysis:  TID Ratio: 1.19  LVEF= 45-50 %  FINDINGS: Regional wall motion:reveals normal myocardial thickening and wall  motion. The overall quality of the study is fair. Artifacts noted: yes Left ventricular cavity: enlarged.  Perfusion Analysis:SPECT images demonstrate homogeneous tracer  distribution throughout the myocardium.  Status Results Details

## 2017-02-24 ENCOUNTER — Ambulatory Visit
Admission: RE | Admit: 2017-02-24 | Discharge: 2017-02-24 | Disposition: A | Discharge status: Home / Self Care | Payer: Commercial Managed Care - PPO | Source: Ambulatory Visit | Attending: Urology | Admitting: Urology

## 2017-02-24 ENCOUNTER — Ambulatory Visit: Payer: Commercial Managed Care - PPO | Admitting: Anesthesiology

## 2017-02-24 ENCOUNTER — Encounter: Payer: Self-pay | Admitting: *Deleted

## 2017-02-24 ENCOUNTER — Encounter
Admission: RE | Disposition: A | Discharge status: Home / Self Care | Payer: Self-pay | Source: Ambulatory Visit | Attending: Urology

## 2017-02-24 ENCOUNTER — Other Ambulatory Visit: Payer: Self-pay

## 2017-02-24 DIAGNOSIS — R310 Gross hematuria: Secondary | ICD-10-CM | POA: Diagnosis not present

## 2017-02-24 DIAGNOSIS — Z87891 Personal history of nicotine dependence: Secondary | ICD-10-CM | POA: Diagnosis not present

## 2017-02-24 DIAGNOSIS — D494 Neoplasm of unspecified behavior of bladder: Secondary | ICD-10-CM | POA: Diagnosis present

## 2017-02-24 DIAGNOSIS — Z79899 Other long term (current) drug therapy: Secondary | ICD-10-CM | POA: Diagnosis not present

## 2017-02-24 DIAGNOSIS — E1122 Type 2 diabetes mellitus with diabetic chronic kidney disease: Secondary | ICD-10-CM | POA: Insufficient documentation

## 2017-02-24 DIAGNOSIS — I129 Hypertensive chronic kidney disease with stage 1 through stage 4 chronic kidney disease, or unspecified chronic kidney disease: Secondary | ICD-10-CM | POA: Insufficient documentation

## 2017-02-24 DIAGNOSIS — N189 Chronic kidney disease, unspecified: Secondary | ICD-10-CM | POA: Insufficient documentation

## 2017-02-24 DIAGNOSIS — K219 Gastro-esophageal reflux disease without esophagitis: Secondary | ICD-10-CM | POA: Diagnosis not present

## 2017-02-24 DIAGNOSIS — M329 Systemic lupus erythematosus, unspecified: Secondary | ICD-10-CM | POA: Insufficient documentation

## 2017-02-24 HISTORY — PX: TRANSURETHRAL RESECTION OF BLADDER TUMOR: SHX2575

## 2017-02-24 SURGERY — TURBT (TRANSURETHRAL RESECTION OF BLADDER TUMOR)
Anesthesia: General | Site: Bladder | Wound class: Clean Contaminated

## 2017-02-24 MED ORDER — LACTATED RINGERS IV SOLN
INTRAVENOUS | Status: DC
  Administered 2017-02-24: 15:00:00 via INTRAVENOUS

## 2017-02-24 MED ORDER — ONDANSETRON HCL 4 MG/2ML IJ SOLN: 4.0000 mg | Freq: Once | INTRAMUSCULAR | Status: DC | PRN

## 2017-02-24 MED ORDER — LIDOCAINE HCL (CARDIAC) 20 MG/ML IV SOLN
INTRAVENOUS | Status: DC | PRN
  Administered 2017-02-24: 60 mg via INTRAVENOUS

## 2017-02-24 MED ORDER — SEVOFLURANE IN SOLN
RESPIRATORY_TRACT | Status: AC
  Filled 2017-02-24: qty 250

## 2017-02-24 MED ORDER — FENTANYL CITRATE (PF) 100 MCG/2ML IJ SOLN
INTRAMUSCULAR | Status: AC
  Filled 2017-02-24: qty 2

## 2017-02-24 MED ORDER — PHENYLEPHRINE HCL 10 MG/ML IJ SOLN
INTRAMUSCULAR | Status: AC
  Filled 2017-02-24: qty 1

## 2017-02-24 MED ORDER — HYDROCODONE-ACETAMINOPHEN 5-325 MG PO TABS: 1.0000 | ORAL_TABLET | Freq: Four times a day (QID) | ORAL | 0 refills | Status: DC | PRN

## 2017-02-24 MED ORDER — PROPOFOL 10 MG/ML IV BOLUS
INTRAVENOUS | Status: AC
  Filled 2017-02-24: qty 20

## 2017-02-24 MED ORDER — DEXAMETHASONE SODIUM PHOSPHATE 10 MG/ML IJ SOLN
INTRAMUSCULAR | Status: DC | PRN
  Administered 2017-02-24: 5 mg via INTRAVENOUS

## 2017-02-24 MED ORDER — BELLADONNA ALKALOIDS-OPIUM 16.2-30 MG RE SUPP
RECTAL | Status: DC | PRN
  Administered 2017-02-24: 60 mg via RECTAL

## 2017-02-24 MED ORDER — PROPOFOL 10 MG/ML IV BOLUS
INTRAVENOUS | Status: DC | PRN
  Administered 2017-02-24: 160 mg via INTRAVENOUS

## 2017-02-24 MED ORDER — ONDANSETRON HCL 4 MG/2ML IJ SOLN
INTRAMUSCULAR | Status: DC | PRN
  Administered 2017-02-24: 4 mg via INTRAVENOUS

## 2017-02-24 MED ORDER — ONDANSETRON HCL 4 MG/2ML IJ SOLN
INTRAMUSCULAR | Status: AC
  Filled 2017-02-24: qty 2

## 2017-02-24 MED ORDER — LIDOCAINE HCL 2 % EX GEL
CUTANEOUS | Status: AC
  Filled 2017-02-24: qty 10

## 2017-02-24 MED ORDER — CEFAZOLIN SODIUM-DEXTROSE 1-4 GM/50ML-% IV SOLN
INTRAVENOUS | Status: AC
  Filled 2017-02-24: qty 50

## 2017-02-24 MED ORDER — FENTANYL CITRATE (PF) 100 MCG/2ML IJ SOLN: 25.0000 ug | INTRAMUSCULAR | Status: DC | PRN

## 2017-02-24 MED ORDER — LIDOCAINE HCL (PF) 2 % IJ SOLN
INTRAMUSCULAR | Status: AC
  Filled 2017-02-24: qty 10

## 2017-02-24 MED ORDER — MIDAZOLAM HCL 2 MG/2ML IJ SOLN
INTRAMUSCULAR | Status: AC
  Filled 2017-02-24: qty 2

## 2017-02-24 MED ORDER — FENTANYL CITRATE (PF) 100 MCG/2ML IJ SOLN
INTRAMUSCULAR | Status: DC | PRN
  Administered 2017-02-24 (×2): 25 ug via INTRAVENOUS

## 2017-02-24 MED ORDER — URIBEL 118 MG PO CAPS: 1.0000 | ORAL_CAPSULE | Freq: Four times a day (QID) | ORAL | 3 refills | Status: DC | PRN

## 2017-02-24 MED ORDER — BELLADONNA ALKALOIDS-OPIUM 16.2-60 MG RE SUPP
RECTAL | Status: AC
  Filled 2017-02-24: qty 1

## 2017-02-24 MED ORDER — LEVOFLOXACIN 500 MG PO TABS: 500.0000 mg | ORAL_TABLET | Freq: Every day | ORAL | 0 refills | Status: DC

## 2017-02-24 MED ORDER — LIDOCAINE HCL 2 % EX GEL
CUTANEOUS | Status: DC | PRN
  Administered 2017-02-24: 1

## 2017-02-24 MED ORDER — DEXAMETHASONE SODIUM PHOSPHATE 10 MG/ML IJ SOLN
INTRAMUSCULAR | Status: AC
  Filled 2017-02-24: qty 1

## 2017-02-24 MED ORDER — CEFAZOLIN SODIUM-DEXTROSE 1-4 GM/50ML-% IV SOLN
1.0000 g | Freq: Once | INTRAVENOUS | Status: AC
  Administered 2017-02-24: 1 g via INTRAVENOUS

## 2017-02-24 MED ORDER — KETOROLAC TROMETHAMINE 30 MG/ML IJ SOLN
INTRAMUSCULAR | Status: AC
  Filled 2017-02-24: qty 1

## 2017-02-24 MED ORDER — MIDAZOLAM HCL 5 MG/5ML IJ SOLN
INTRAMUSCULAR | Status: DC | PRN
  Administered 2017-02-24: 2 mg via INTRAVENOUS

## 2017-02-24 MED ORDER — PHENYLEPHRINE HCL 10 MG/ML IJ SOLN
INTRAMUSCULAR | Status: DC | PRN
  Administered 2017-02-24 (×3): 100 ug via INTRAVENOUS

## 2017-02-24 MED ORDER — KETOROLAC TROMETHAMINE 30 MG/ML IJ SOLN
INTRAMUSCULAR | Status: DC | PRN
  Administered 2017-02-24: 30 mg via INTRAVENOUS

## 2017-02-24 SURGICAL SUPPLY — 20 items
BAG DRAIN CYSTO-URO LG1000N (MISCELLANEOUS) ×3 IMPLANT
BAG URINE DRAINAGE (UROLOGICAL SUPPLIES) ×3 IMPLANT
CATH FOLEY 2WAY  5CC 20FR SIL (CATHETERS)
CATH FOLEY 2WAY 5CC 20FR SIL (CATHETERS) IMPLANT
DRSG TELFA 4X3 1S NADH ST (GAUZE/BANDAGES/DRESSINGS) ×3 IMPLANT
ELECT LOOP 22F BIPOLAR SML (ELECTROSURGICAL)
ELECTRODE LOOP 22F BIPOLAR SML (ELECTROSURGICAL) IMPLANT
GLOVE BIO SURGEON STRL SZ7.5 (GLOVE) ×3 IMPLANT
GOWN STRL REUS W/ TWL LRG LVL4 (GOWN DISPOSABLE) ×1 IMPLANT
GOWN STRL REUS W/ TWL XL LVL3 (GOWN DISPOSABLE) ×1 IMPLANT
GOWN STRL REUS W/TWL LRG LVL4 (GOWN DISPOSABLE) ×2
GOWN STRL REUS W/TWL XL LVL3 (GOWN DISPOSABLE) ×2
KIT TURNOVER CYSTO (KITS) ×3 IMPLANT
LOOP CUT BIPOLAR 24F LRG (ELECTROSURGICAL) ×3 IMPLANT
PACK CYSTO AR (MISCELLANEOUS) ×3 IMPLANT
SET IRRIG Y TYPE TUR BLADDER L (SET/KITS/TRAYS/PACK) ×6 IMPLANT
SOL .9 NS 3000ML IRR  AL (IV SOLUTION) ×8
SOL .9 NS 3000ML IRR UROMATIC (IV SOLUTION) ×4 IMPLANT
SYRINGE IRR TOOMEY STRL 70CC (SYRINGE) ×3 IMPLANT
WATER STERILE IRR 1000ML POUR (IV SOLUTION) ×3 IMPLANT

## 2017-02-24 NOTE — Anesthesia Procedure Notes (Signed)
Procedure Name: LMA Insertion Date/Time: 02/24/2017 4:15 PM Performed by: Dionne Bucy, CRNA Pre-anesthesia Checklist: Patient identified, Patient being monitored, Timeout performed, Emergency Drugs available and Suction available Patient Re-evaluated:Patient Re-evaluated prior to induction Oxygen Delivery Method: Circle system utilized Preoxygenation: Pre-oxygenation with 100% oxygen Induction Type: IV induction Ventilation: Mask ventilation without difficulty LMA: LMA inserted LMA Size: 5.0 Tube type: Oral Number of attempts: 1 Placement Confirmation: positive ETCO2 and breath sounds checked- equal and bilateral Tube secured with: Tape Dental Injury: Teeth and Oropharynx as per pre-operative assessment

## 2017-02-24 NOTE — Anesthesia Preprocedure Evaluation (Signed)
Anesthesia Evaluation  Patient identified by MRN, date of birth, ID band Patient awake    Reviewed: Allergy & Precautions, NPO status , Patient's Chart, lab work & pertinent test results  History of Anesthesia Complications Negative for: history of anesthetic complications  Airway Mallampati: II       Dental  (+) Teeth Intact   Pulmonary former smoker,    Pulmonary exam normal        Cardiovascular hypertension, negative cardio ROS Normal cardiovascular exam     Neuro/Psych negative neurological ROS  negative psych ROS   GI/Hepatic Neg liver ROS, GERD  Medicated and Controlled,  Endo/Other  diabetes  Renal/GU Renal InsufficiencyRenal disease     Musculoskeletal  (+) Arthritis , Osteoarthritis,    Abdominal Normal abdominal exam  (+)   Peds  Hematology negative hematology ROS (+)   Anesthesia Other Findings   Reproductive/Obstetrics                             Anesthesia Physical  Anesthesia Plan  ASA: II  Anesthesia Plan: General   Post-op Pain Management:    Induction: Intravenous  PONV Risk Score and Plan:   Airway Management Planned: LMA  Additional Equipment:   Intra-op Plan:   Post-operative Plan:   Informed Consent: I have reviewed the patients History and Physical, chart, labs and discussed the procedure including the risks, benefits and alternatives for the proposed anesthesia with the patient or authorized representative who has indicated his/her understanding and acceptance.     Plan Discussed with:   Anesthesia Plan Comments:         Anesthesia Quick Evaluation

## 2017-02-24 NOTE — H&P (Signed)
Date of Initial H&P: 02/10/17  History reviewed, patient examined, no change in status, stable for surgery.

## 2017-02-24 NOTE — Anesthesia Postprocedure Evaluation (Signed)
Anesthesia Post Note  Patient: Nicholas Vazquez.  Procedure(s) Performed: TRANSURETHRAL RESECTION OF BLADDER TUMOR (TURBT) (N/A Bladder)  Patient location during evaluation: PACU Anesthesia Type: General Level of consciousness: awake and alert Pain management: pain level controlled Vital Signs Assessment: post-procedure vital signs reviewed and stable Respiratory status: spontaneous breathing and respiratory function stable Cardiovascular status: stable Anesthetic complications: no     Last Vitals:  Vitals:   02/24/17 1431  BP: 128/77  Pulse: 78  Resp: 16  Temp: 36.8 C  SpO2: 98%    Last Pain:  Vitals:   02/24/17 1431  TempSrc: Oral  PainSc: 7                  Woodard Perrell K

## 2017-02-24 NOTE — Transfer of Care (Signed)
Immediate Anesthesia Transfer of Care Note  Patient: Nicholas Vazquez.  Procedure(s) Performed: TRANSURETHRAL RESECTION OF BLADDER TUMOR (TURBT) (N/A Bladder)  Patient Location: PACU  Anesthesia Type:General  Level of Consciousness: sedated  Airway & Oxygen Therapy: Patient Spontanous Breathing and Patient connected to face mask oxygen  Post-op Assessment: Report given to RN and Post -op Vital signs reviewed and stable  Post vital signs: Reviewed and stable  Last Vitals:  Vitals:   02/24/17 1431  BP: 128/77  Pulse: 78  Resp: 16  Temp: 36.8 C  SpO2: 98%    Last Pain:  Vitals:   02/24/17 1431  TempSrc: Oral  PainSc: 7       Patients Stated Pain Goal: 3 (50/38/88 2800)  Complications: No apparent anesthesia complications

## 2017-02-24 NOTE — Op Note (Signed)
Preoperative diagnosis: 1.  Bladder neck tumor                                             2.  Gross hematuria  Postoperative diagnosis: Same  Procedure:  Transurethral resection of bladder neck tumor  Surgeon: Otelia Limes. Yves Dill MD  Anesthesia: General  Indications:See the history and physical. After informed consent the above procedure(s) were requested     Technique and findings: After adequate general anesthesia had been obtained the patient was placed into dorsal lithotomy position and the perineum was prepped and draped in the usual fashion.  The 21 French cystoscope was coupled the camera and visually advanced into the bladder.  The patient had a 2-3 mm papillary lesion at the 9 o'clock position of the bladder neck.  Patient also had an eschar located at the 3 o'clock position.  The bladder was thoroughly inspected.  No bladder lesions were identified.  Prostate appeared to be atrophic with some hypervascularity of the prostatic urethral surface.  Both ureteral orifices were identified and had clear efflux.  The cystoscope was removed and replaced with the 24 French resectoscope.  Both lesions were removed with the loop electrode and base cauterized with the loop electrode.  Specimens were submitted to pathology.  The resectoscope was then removed and procedure terminated.  10 cc of viscous Xylocaine was instilled within the urethra.  A B&O suppository was placed.  The procedure was then terminated and patient transferred to the recovery room in stable condition.

## 2017-02-24 NOTE — Anesthesia Post-op Follow-up Note (Signed)
Anesthesia QCDR form completed.        

## 2017-02-24 NOTE — Discharge Instructions (Signed)
AMBULATORY SURGERY  DISCHARGE INSTRUCTIONS   1) The drugs that you were given will stay in your system until tomorrow so for the next 24 hours you should not:  A) Drive an automobile B) Make any legal decisions C) Drink any alcoholic beverage   2) You may resume regular meals tomorrow.  Today it is better to start with liquids and gradually work up to solid foods.  You may eat anything you prefer, but it is better to start with liquids, then soup and crackers, and gradually work up to solid foods.   3) Please notify your doctor immediately if you have any unusual bleeding, trouble breathing, redness and pain at the surgery site, drainage, fever, or pain not relieved by medication. 4)   5) Your post-operative visit with Dr.                                     is: Date:                        Time:    Please call to schedule your post-operative visit.  6) Additional Instructions:       Hematuria, Adult Hematuria is blood in your urine. It can be caused by a bladder infection, kidney infection, prostate infection, kidney stone, or cancer of your urinary tract. Infections can usually be treated with medicine, and a kidney stone usually will pass through your urine. If neither of these is the cause of your hematuria, further workup to find out the reason may be needed. It is very important that you tell your health care provider about any blood you see in your urine, even if the blood stops without treatment or happens without causing pain. Blood in your urine that happens and then stops and then happens again can be a symptom of a very serious condition. Also, pain is not a symptom in the initial stages of many urinary cancers. Follow these instructions at home:  Drink lots of fluid, 3-4 quarts a day. If you have been diagnosed with an infection, cranberry juice is especially recommended, in addition to large amounts of water.  Avoid caffeine, tea, and carbonated beverages because  they tend to irritate the bladder.  Avoid alcohol because it may irritate the prostate.  Take all medicines as directed by your health care provider.  If you were prescribed an antibiotic medicine, finish it all even if you start to feel better.  If you have been diagnosed with a kidney stone, follow your health care provider's instructions regarding straining your urine to catch the stone.  Empty your bladder often. Avoid holding urine for long periods of time.  After a bowel movement, women should cleanse front to back. Use each tissue only once.  Empty your bladder before and after sexual intercourse if you are a male. Contact a health care provider if:  You develop back pain.  You have a fever.  You have a feeling of sickness in your stomach (nausea) or vomiting.  Your symptoms are not better in 3 days. Return sooner if you are getting worse. Get help right away if:  You develop severe vomiting and are unable to keep the medicine down.  You develop severe back or abdominal pain despite taking your medicines.  You begin passing a large amount of blood or clots in your urine.  You feel extremely weak or faint,  or you pass out. This information is not intended to replace advice given to you by your health care provider. Make sure you discuss any questions you have with your health care provider. Document Released: 12/23/2004 Document Revised: 05/31/2015 Document Reviewed: 08/23/2012 Elsevier Interactive Patient Education  2017 [employer].

## 2017-02-25 ENCOUNTER — Encounter: Payer: Self-pay | Admitting: Urology

## 2017-02-25 MED FILL — Belladonna Alkaloids & Opium Suppos 16.2-60 MG: RECTAL | Qty: 1 | Status: AC

## 2017-02-27 LAB — SURGICAL PATHOLOGY

## 2017-05-02 ENCOUNTER — Other Ambulatory Visit: Payer: Self-pay

## 2017-05-02 ENCOUNTER — Emergency Department: Payer: Commercial Managed Care - PPO

## 2017-05-02 ENCOUNTER — Emergency Department
Admission: EM | Admit: 2017-05-02 | Discharge: 2017-05-02 | Disposition: A | Discharge status: Home / Self Care | Payer: Commercial Managed Care - PPO | Attending: Emergency Medicine | Admitting: Emergency Medicine

## 2017-05-02 ENCOUNTER — Encounter: Payer: Self-pay | Admitting: Emergency Medicine

## 2017-05-02 DIAGNOSIS — E119 Type 2 diabetes mellitus without complications: Secondary | ICD-10-CM | POA: Diagnosis not present

## 2017-05-02 DIAGNOSIS — Z8546 Personal history of malignant neoplasm of prostate: Secondary | ICD-10-CM | POA: Insufficient documentation

## 2017-05-02 DIAGNOSIS — Z87891 Personal history of nicotine dependence: Secondary | ICD-10-CM | POA: Insufficient documentation

## 2017-05-02 DIAGNOSIS — Z79899 Other long term (current) drug therapy: Secondary | ICD-10-CM | POA: Diagnosis not present

## 2017-05-02 DIAGNOSIS — R319 Hematuria, unspecified: Secondary | ICD-10-CM

## 2017-05-02 DIAGNOSIS — I10 Essential (primary) hypertension: Secondary | ICD-10-CM | POA: Diagnosis not present

## 2017-05-02 DIAGNOSIS — N390 Urinary tract infection, site not specified: Secondary | ICD-10-CM | POA: Diagnosis not present

## 2017-05-02 LAB — CBC WITH DIFFERENTIAL/PLATELET
Basophils Absolute: 0 10*3/uL (ref 0–0.1)
Basophils Relative: 1 %
Eosinophils Absolute: 0.1 10*3/uL (ref 0–0.7)
Eosinophils Relative: 2 %
HCT: 37.2 % — ABNORMAL LOW (ref 40.0–52.0)
Hemoglobin: 12.9 g/dL — ABNORMAL LOW (ref 13.0–18.0)
Lymphocytes Relative: 17 %
Lymphs Abs: 0.8 10*3/uL — ABNORMAL LOW (ref 1.0–3.6)
MCH: 32.5 pg (ref 26.0–34.0)
MCHC: 34.7 g/dL (ref 32.0–36.0)
MCV: 93.9 fL (ref 80.0–100.0)
Monocytes Absolute: 0.5 10*3/uL (ref 0.2–1.0)
Monocytes Relative: 11 %
Neutro Abs: 3.1 10*3/uL (ref 1.4–6.5)
Neutrophils Relative %: 69 %
Platelets: 186 10*3/uL (ref 150–440)
RBC: 3.97 MIL/uL — ABNORMAL LOW (ref 4.40–5.90)
RDW: 12.8 % (ref 11.5–14.5)
WBC: 4.5 10*3/uL (ref 3.8–10.6)

## 2017-05-02 LAB — COMPREHENSIVE METABOLIC PANEL
ALT: 15 U/L — ABNORMAL LOW (ref 17–63)
AST: 21 U/L (ref 15–41)
Albumin: 3.6 g/dL (ref 3.5–5.0)
Alkaline Phosphatase: 77 U/L (ref 38–126)
Anion gap: 6 (ref 5–15)
BUN: 17 mg/dL (ref 6–20)
CO2: 25 mmol/L (ref 22–32)
Calcium: 8.5 mg/dL — ABNORMAL LOW (ref 8.9–10.3)
Chloride: 108 mmol/L (ref 101–111)
Creatinine, Ser: 1.04 mg/dL (ref 0.61–1.24)
GFR calc Af Amer: >60 mL/min (ref >60)
GFR calc non Af Amer: >60 mL/min (ref >60)
Glucose, Bld: 150 mg/dL — ABNORMAL HIGH (ref 65–99)
Potassium: 4.1 mmol/L (ref 3.5–5.1)
Sodium: 139 mmol/L (ref 135–145)
Total Bilirubin: 0.5 mg/dL (ref 0.3–1.2)
Total Protein: 7.6 g/dL (ref 6.5–8.1)

## 2017-05-02 LAB — URINALYSIS, COMPLETE (UACMP) WITH MICROSCOPIC
Bacteria, UA: NONE SEEN
RBC / HPF: >50 RBC/hpf — ABNORMAL HIGH (ref 0–5)
Specific Gravity, Urine: 1.025 (ref 1.005–1.030)
Squamous Epithelial / LPF: NONE SEEN (ref 0–5)
WBC, UA: >50 WBC/hpf — ABNORMAL HIGH (ref 0–5)

## 2017-05-02 LAB — CK: Total CK: 91 U/L (ref 49–397)

## 2017-05-02 MED ORDER — IOPAMIDOL (ISOVUE-370) INJECTION 76%
75.0000 mL | Freq: Once | INTRAVENOUS | Status: AC | PRN
  Administered 2017-05-02: 75 mL via INTRAVENOUS

## 2017-05-02 MED ORDER — CIPROFLOXACIN HCL 500 MG PO TABS
750.0000 mg | ORAL_TABLET | Freq: Once | ORAL | Status: AC
  Administered 2017-05-02: 750 mg via ORAL
  Filled 2017-05-02: qty 2

## 2017-05-02 MED ORDER — CIPROFLOXACIN HCL 500 MG PO TABS: 500.0000 mg | ORAL_TABLET | Freq: Two times a day (BID) | ORAL | 0 refills | Status: AC

## 2017-05-02 NOTE — ED Notes (Signed)
Pt states he was able to urinate a lot more after sitting down on the toilet to urinate.  Pt bladder still has over 345ml, pt going to try to urinate again.  CT pending at this time.

## 2017-05-02 NOTE — ED Notes (Signed)
Per Dr. Cinda Quest, hold on inserting foley catheter since pt was able to urinate. Pt was unable to get sample, but was advised a sample is needed.

## 2017-05-02 NOTE — ED Notes (Signed)
Patient transported to CT 

## 2017-05-02 NOTE — Discharge Instructions (Signed)
You have a lot of white blood cells mixed in with the red blood cells.  They are about the same amount of each which makes me think you have an infection.  Urinary tract infection can easily make you have bloody urine.  Please take the Cipro antibiotic 1 twice a day. Please drink plenty of fluids. Give Dr. Eliberto Ivory a call on Monday.  Have him follow-up with you.  If you develop more problems urinating and you cannot pass your water please return here.  We can put in a catheter as I discussed with you earlier.  Please return for nausea vomiting fever worse pain or feeling sicker.

## 2017-05-02 NOTE — ED Provider Notes (Addendum)
[facility] Emergency Department Provider Note   ____________________________________________   First MD Initiated Contact with Patient 05/02/17 [phone]     (approximate)  I have reviewed the triage vital signs and the nursing notes.   HISTORY  Chief Complaint Hematuria    HPI Nicholas Vazquez. is a 62 y.o. male who has had a history of bladder cancer and recently had a nonmalignant tumor removed reports he got up this morning and began urinating and became bloody.  He has had hematuria before but not since he had the last surgery.  Arrival here patient felt the need to urinate but could not he sat down and was able to urinate some grossly bloody urine.  He had 500 cc by bladder ultrasound before urinating and 300 after.  We will have him try one more time until he is empty and then re-ultrasound him.  He is not lightheaded or having any other discomfort at present although he says for the last week or so he has been having pain in both of his kidney areas.  Pain is achy in nature.   Past Medical History:  Diagnosis Date  . Abnormal ultrasound of prostate November 07, 2014  . Acute back pain with sciatica    Midline Low back- Right-side  . Arthritis    Lumbosacral DDD and  Rheumatoid  . Cancer ([place]) 2017   PROSTATE  . Chronic kidney disease    H/O KIDNEY STONES  . Collagen vascular disease (HCC)    Lupus  . Diabetes mellitus without complication ([place])    Type II; pt stated it was due to steroid injections he was given-GLUCOSE NORMALIZED AFTER THAT  . Enlarged prostate   . GERD (gastroesophageal reflux disease)   . Glaucoma    Bilateral  . History of kidney stones   . Hypertension   . Lupus [facility])     Patient Active Problem List   Diagnosis Date Noted  . OA (osteoarthritis) 01/13/2017  . Cancer of prostate ([place]) 03/27/2015  . Health care maintenance 03/27/2015  . Back pain 11/09/2014  . Glaucoma 04/11/2013  . Iritis 04/11/2013  . Kidney  stones 04/11/2013  . Lipoma of scalp 04/11/2013  . Lipoma of shoulder 04/11/2013  . Lupus ([place]) 04/11/2013  . Type 2 diabetes mellitus with chronic kidney disease ([place]) 04/11/2013    Past Surgical History:  Procedure Laterality Date  . ABDOMINAL EXPOSURE N/A 11/09/2014   Procedure: ABDOMINAL EXPOSURE;  Surgeon: Angelia Mould, MD;  Location: [place];  Service: Vascular;  Laterality: N/A;  . ANTERIOR LUMBAR FUSION N/A 11/09/2014   Procedure: ANTERIOR LUMBAR FUSION 1 LEVEL L5 - S1;  Surgeon: Melina Schools, MD;  Location: [place];  Service: Orthopedics;  Laterality: N/A;  . COLONOSCOPY W/ POLYPECTOMY    . EXTRACORPOREAL SHOCK WAVE LITHOTRIPSY Right 11/20/2016   Procedure: EXTRACORPOREAL SHOCK WAVE LITHOTRIPSY (ESWL);  Surgeon: Royston Cowper, MD;  Location: [facility];  Service: Urology;  Laterality: Right;  . EXTRACORPOREAL SHOCK WAVE LITHOTRIPSY Right 12/04/2016   Procedure: EXTRACORPOREAL SHOCK WAVE LITHOTRIPSY (ESWL);  Surgeon: Royston Cowper, MD;  Location: [facility];  Service: Urology;  Laterality: Right;  . FOOT SURGERY Bilateral   . GREEN LIGHT LASER TURP (TRANSURETHRAL RESECTION OF PROSTATE N/A 01/30/2015   Procedure: GREEN LIGHT LASER TURP (TRANSURETHRAL RESECTION OF PROSTATE;  Surgeon: Royston Cowper, MD;  Location: [facility];  Service: Urology;  Laterality: N/A;  . PROSTATE BIOPSY  October 2016  . TRANSURETHRAL RESECTION OF  BLADDER TUMOR N/A 02/24/2017   Procedure: TRANSURETHRAL RESECTION OF BLADDER TUMOR (TURBT);  Surgeon: Royston Cowper, MD;  Location: [facility];  Service: Urology;  Laterality: N/A;  . TUMOR REMOVAL      Prior to Admission medications   Medication Sig Start Date End Date Taking? Authorizing Provider  amLODipine (NORVASC) 5 MG tablet Take 5 mg by mouth at bedtime.     [provider]  brimonidine (ALPHAGAN) 0.2 % ophthalmic solution Place 1 drop into the left eye 2 (two) times daily.     [provider]  ciprofloxacin (CIPRO) 500 MG tablet  Take 1 tablet (500 mg total) by mouth 2 (two) times daily for 10 days. 05/02/17 05/12/17  Nena Polio, MD  Dorzolamide HCl-Timolol Mal PF 22.3-6.8 MG/ML SOLN Place 1 drop into both eyes 2 (two) times daily.     [provider]  etodolac (LODINE) 500 MG tablet Take 500 mg 2 (two) times daily by mouth.    [provider]  gabapentin (NEURONTIN) 100 MG capsule Take 100 mg by mouth at bedtime.     [provider]  HYDROcodone-acetaminophen (NORCO/VICODIN) 5-325 MG tablet Take 1-2 tablets by mouth every 6 (six) hours as needed for moderate pain. 02/24/17   Royston Cowper, MD  hydroxychloroquine (PLAQUENIL) 200 MG tablet Take 200 mg by mouth 2 (two) times daily. Sulfate 09/30/14   [provider]  lansoprazole (PREVACID) 15 MG capsule Take 15 mg by mouth every morning.    [provider]  latanoprost (XALATAN) 0.005 % ophthalmic solution Place 1 drop into both eyes at bedtime.     [provider]  levofloxacin (LEVAQUIN) 500 MG tablet Take 1 tablet (500 mg total) by mouth daily. 02/24/17   Royston Cowper, MD  linaclotide [facility]) 145 MCG CAPS capsule Take 145 mcg by mouth daily before breakfast.    [provider]  Meth-Hyo-M Bl-Na Phos-Ph Sal (URIBEL) 118 MG CAPS Take 1 capsule (118 mg total) by mouth every 6 (six) hours as needed (dysuria). 02/24/17   Royston Cowper, MD    Allergies Sulfa antibiotics  Family History  Problem Relation Age of Onset  . Diabetes Mother   . Varicose Veins Mother   . Cancer Father     Social History Social History   Tobacco Use  . Smoking status: Former Smoker    Packs/day: 0.25    Types: Cigarettes    Last attempt to quit: 01/21/1994    Years since quitting: 23.2  . Smokeless tobacco: Never Used  . Tobacco comment: SOCIAL SMOKER ONLY  Substance Use Topics  . Alcohol use: No    Alcohol/week: 0.0 oz  . Drug use: No    Review of Systems  Constitutional: No fever/chills Eyes: No visual  changes. ENT: No sore throat. Cardiovascular: Denies chest pain. Respiratory: Denies shortness of breath. Gastrointestinal: No abdominal pain.  No nausea, no vomiting.  No diarrhea.  No constipation. Genitourinary: Negative for dysuria. Musculoskeletal: back pain as described in HPI. Skin: Negative for rash. Neurological: Negative for headaches, focal weakness  ____________________________________________   PHYSICAL EXAM:  VITAL SIGNS: ED Triage Vitals  Enc Vitals Group     BP 05/02/17 0748 (!) 149/89     Pulse Rate 05/02/17 0748 75     Resp 05/02/17 0748 16     Temp 05/02/17 0748 98.1 F (36.7 C)     Temp Source 05/02/17 0748 Oral     SpO2 05/02/17 0748 99 %  Weight 05/02/17 0749 210 lb (95.3 kg)     Height 05/02/17 0749 6' (1.829 m)     Head Circumference --      Peak Flow --      Pain Score 05/02/17 0748 8     Pain Loc --      Pain Edu? --      Excl. in [place]? --     Constitutional: Alert and oriented. Well appearing and in no acute distress. Eyes: Conjunctivae are normal. Head: Atraumatic. Nose: No congestion/rhinnorhea. Mouth/Throat: Mucous membranes are moist.  Oropharynx non-erythematous. Neck: No stridor.   Cardiovascular: Normal rate, regular rhythm. Grossly normal heart sounds.  Good peripheral circulation. Respiratory: Normal respiratory effort.  No retractions. Lungs CTAB. Gastrointestinal: Soft and nontender. No distention. No abdominal bruits. No CVA tenderness at present. Musculoskeletal: No lower extremity tenderness nor edema.  No joint effusions. Neurologic:  Normal speech and language. No gross focal neurologic deficits are appreciated. No gait instability. Skin:  Skin is warm, dry and intact. No rash noted. Psychiatric: Mood and affect are normal. Speech and behavior are normal.  ____________________________________________   LABS (all labs ordered are listed, but only abnormal results are displayed)  Labs Reviewed  COMPREHENSIVE METABOLIC  PANEL - Abnormal; Notable for the following components:      Result Value   Glucose, Bld 150 (*)    Calcium 8.5 (*)    ALT 15 (*)    All other components within normal limits  URINALYSIS, COMPLETE (UACMP) WITH MICROSCOPIC - Abnormal; Notable for the following components:   Color, Urine RED (*)    APPearance BLOODY (*)    Glucose, UA   (*)    Value: TEST NOT REPORTED DUE TO COLOR INTERFERENCE OF URINE PIGMENT   Hgb urine dipstick   (*)    Value: TEST NOT REPORTED DUE TO COLOR INTERFERENCE OF URINE PIGMENT   Bilirubin Urine   (*)    Value: TEST NOT REPORTED DUE TO COLOR INTERFERENCE OF URINE PIGMENT   Ketones, ur   (*)    Value: TEST NOT REPORTED DUE TO COLOR INTERFERENCE OF URINE PIGMENT   Protein, ur   (*)    Value: TEST NOT REPORTED DUE TO COLOR INTERFERENCE OF URINE PIGMENT   Nitrite   (*)    Value: TEST NOT REPORTED DUE TO COLOR INTERFERENCE OF URINE PIGMENT   Leukocytes, UA   (*)    Value: TEST NOT REPORTED DUE TO COLOR INTERFERENCE OF URINE PIGMENT   RBC / HPF >50 (*)    WBC, UA >50 (*)    All other components within normal limits  CBC WITH DIFFERENTIAL/PLATELET - Abnormal; Notable for the following components:   RBC 3.97 (*)    Hemoglobin 12.9 (*)    HCT 37.2 (*)    Lymphs Abs 0.8 (*)    All other components within normal limits  URINE CULTURE  CK   ____________________________________________  EKG   ____________________________________________  RADIOLOGY  ED MD interpretation: CT only shows what appears to be clot in the bladder.  Official radiology report(s): Ct Abdomen Pelvis W Contrast  Result Date: 05/02/2017 CLINICAL DATA:  Hematuria EXAM: CT ABDOMEN AND PELVIS WITH CONTRAST TECHNIQUE: Multidetector CT imaging of the abdomen and pelvis was performed using the standard protocol following bolus administration of intravenous contrast. CONTRAST:  45mL ISOVUE-370 IOPAMIDOL (ISOVUE-370) INJECTION 76% COMPARISON:  12/30/2016 FINDINGS: Lower chest: Bibasilar  dependent atelectasis. Hepatobiliary: Unremarkable Pancreas: Unremarkable Spleen: Unremarkable Adrenals/Urinary Tract: Scarring in the upper pole of  the left kidney. Small cyst in the left kidney. Small calculus in the lower pole of the right kidney. No definite ureteral calculus. There is a serpiginous soft tissue density in the dependent portion of the bladder which may simply represent thrombus. Bladder wall thickening has resolved. Bladder is mildly distended. Stomach/Bowel: Normal appendix. No obvious mass in the colon. No evidence of small-bowel obstruction. Stomach is decompressed. Unremarkable duodenum. Vascular/Lymphatic: No abnormal retroperitoneal adenopathy. Mildly enlarged left inguinal lymph node measures 1.3 cm in short axis diameter. Reproductive: Brachy therapy pellets in the prostate gland. Other: No free fluid. Musculoskeletal: L5-S1 postoperative changes from fusion. Solid fusion across the disc space has not yet occurred. IMPRESSION: Serpiginous soft tissue density towards the base of the bladder likely represents thrombus. The bladder is mildly distended. Right nephrolithiasis.  No definite ureteral calculus. Mild left inguinal abnormal adenopathy. Electronically Signed   By: Marybelle Killings M.D.   On: 05/02/2017 10:22    ____________________________________________   PROCEDURES  Procedure(s) performed:   Procedures  Critical Care performed:   ____________________________________________   INITIAL IMPRESSION / ASSESSMENT AND PLAN / ED COURSE              ____________________________________________   FINAL CLINICAL IMPRESSION(S) / ED DIAGNOSES  Final diagnoses:  Urinary tract infection with hematuria, site unspecified  Hematuria, unspecified type     ED Discharge Orders        Ordered    ciprofloxacin (CIPRO) 500 MG tablet  2 times daily     05/02/17 1056       Note:  This document was prepared using Dragon voice recognition software and may include  unintentional dictation errors.    Nena Polio, MD 05/02/17 1058    Nena Polio, MD 05/02/17 [phone]

## 2017-05-02 NOTE — ED Notes (Signed)
Pt urinated about 450 ml of red urine. Collected into urinal at bedside.

## 2017-05-02 NOTE — ED Triage Notes (Signed)
Pt here with c/o bladder pain/pressure, states he has prostate cancer, was urinating this am when his urine became bloody and he started passing clots. NAD at this time.

## 2017-05-03 LAB — URINE CULTURE: Culture: NO GROWTH

## 2018-01-06 DIAGNOSIS — E119 Type 2 diabetes mellitus without complications: Secondary | ICD-10-CM | POA: Diagnosis not present

## 2018-02-06 DIAGNOSIS — E119 Type 2 diabetes mellitus without complications: Secondary | ICD-10-CM | POA: Diagnosis not present

## 2018-03-03 DIAGNOSIS — N181 Chronic kidney disease, stage 1: Secondary | ICD-10-CM | POA: Diagnosis not present

## 2018-03-03 DIAGNOSIS — E1122 Type 2 diabetes mellitus with diabetic chronic kidney disease: Secondary | ICD-10-CM | POA: Diagnosis not present

## 2018-03-07 DIAGNOSIS — E119 Type 2 diabetes mellitus without complications: Secondary | ICD-10-CM | POA: Diagnosis not present

## 2018-03-10 DIAGNOSIS — M329 Systemic lupus erythematosus, unspecified: Secondary | ICD-10-CM | POA: Diagnosis not present

## 2018-03-10 DIAGNOSIS — E1122 Type 2 diabetes mellitus with diabetic chronic kidney disease: Secondary | ICD-10-CM | POA: Diagnosis not present

## 2018-03-10 DIAGNOSIS — M48061 Spinal stenosis, lumbar region without neurogenic claudication: Secondary | ICD-10-CM | POA: Diagnosis not present

## 2018-03-10 DIAGNOSIS — M5136 Other intervertebral disc degeneration, lumbar region: Secondary | ICD-10-CM | POA: Diagnosis not present

## 2018-03-10 DIAGNOSIS — M791 Myalgia, unspecified site: Secondary | ICD-10-CM | POA: Diagnosis not present

## 2018-03-23 DIAGNOSIS — M9904 Segmental and somatic dysfunction of sacral region: Secondary | ICD-10-CM | POA: Diagnosis not present

## 2018-03-23 DIAGNOSIS — I10 Essential (primary) hypertension: Secondary | ICD-10-CM | POA: Insufficient documentation

## 2018-03-23 DIAGNOSIS — M545 Low back pain: Secondary | ICD-10-CM | POA: Diagnosis not present

## 2018-03-23 DIAGNOSIS — C61 Malignant neoplasm of prostate: Secondary | ICD-10-CM | POA: Insufficient documentation

## 2018-03-24 ENCOUNTER — Other Ambulatory Visit
Admission: RE | Admit: 2018-03-24 | Discharge: 2018-03-24 | Disposition: A | Discharge status: Home / Self Care | Payer: Commercial Managed Care - PPO | Source: Ambulatory Visit | Attending: Internal Medicine | Admitting: Internal Medicine

## 2018-03-24 DIAGNOSIS — R079 Chest pain, unspecified: Secondary | ICD-10-CM | POA: Insufficient documentation

## 2018-03-24 DIAGNOSIS — R51 Headache: Secondary | ICD-10-CM | POA: Insufficient documentation

## 2018-03-24 LAB — TROPONIN I: Troponin I: <0.03 ng/mL (ref <0.03)

## 2018-03-24 LAB — FIBRIN DERIVATIVES D-DIMER (ARMC ONLY): Fibrin derivatives D-dimer (ARMC): 310.11 ng/mL (FEU) (ref 0.00–499.00)

## 2018-03-30 DIAGNOSIS — R1110 Vomiting, unspecified: Secondary | ICD-10-CM | POA: Diagnosis not present

## 2018-03-30 DIAGNOSIS — R52 Pain, unspecified: Secondary | ICD-10-CM | POA: Diagnosis not present

## 2018-03-30 DIAGNOSIS — R519 Headache, unspecified: Secondary | ICD-10-CM | POA: Insufficient documentation

## 2018-03-30 DIAGNOSIS — R05 Cough: Secondary | ICD-10-CM | POA: Diagnosis not present

## 2018-04-07 DIAGNOSIS — E119 Type 2 diabetes mellitus without complications: Secondary | ICD-10-CM | POA: Diagnosis not present

## 2018-04-13 DIAGNOSIS — M9904 Segmental and somatic dysfunction of sacral region: Secondary | ICD-10-CM | POA: Diagnosis not present

## 2018-09-29 ENCOUNTER — Other Ambulatory Visit: Payer: Self-pay | Admitting: Internal Medicine

## 2018-09-29 DIAGNOSIS — M5430 Sciatica, unspecified side: Secondary | ICD-10-CM

## 2018-10-06 ENCOUNTER — Encounter (INDEPENDENT_AMBULATORY_CARE_PROVIDER_SITE_OTHER): Payer: Self-pay | Admitting: Nurse Practitioner

## 2018-10-06 ENCOUNTER — Encounter (INDEPENDENT_AMBULATORY_CARE_PROVIDER_SITE_OTHER): Payer: Self-pay

## 2018-10-08 ENCOUNTER — Other Ambulatory Visit (INDEPENDENT_AMBULATORY_CARE_PROVIDER_SITE_OTHER): Payer: Self-pay | Admitting: Nurse Practitioner

## 2018-10-08 DIAGNOSIS — I8390 Asymptomatic varicose veins of unspecified lower extremity: Secondary | ICD-10-CM

## 2018-10-11 ENCOUNTER — Ambulatory Visit (INDEPENDENT_AMBULATORY_CARE_PROVIDER_SITE_OTHER): Payer: Commercial Managed Care - PPO

## 2018-10-11 ENCOUNTER — Ambulatory Visit (INDEPENDENT_AMBULATORY_CARE_PROVIDER_SITE_OTHER): Payer: Commercial Managed Care - PPO | Admitting: Nurse Practitioner

## 2018-10-11 ENCOUNTER — Other Ambulatory Visit: Payer: Self-pay

## 2018-10-11 ENCOUNTER — Encounter (INDEPENDENT_AMBULATORY_CARE_PROVIDER_SITE_OTHER): Payer: Self-pay | Admitting: Nurse Practitioner

## 2018-10-11 VITALS — BP 146/83 | HR 82 | Resp 10 | Ht 71.0 in | Wt 209.0 lb

## 2018-10-11 DIAGNOSIS — I10 Essential (primary) hypertension: Secondary | ICD-10-CM | POA: Diagnosis not present

## 2018-10-11 DIAGNOSIS — E1122 Type 2 diabetes mellitus with diabetic chronic kidney disease: Secondary | ICD-10-CM | POA: Diagnosis not present

## 2018-10-11 DIAGNOSIS — I8390 Asymptomatic varicose veins of unspecified lower extremity: Secondary | ICD-10-CM | POA: Diagnosis not present

## 2018-10-11 DIAGNOSIS — I83813 Varicose veins of bilateral lower extremities with pain: Secondary | ICD-10-CM | POA: Diagnosis not present

## 2018-10-11 NOTE — Progress Notes (Signed)
SUBJECTIVE:  Patient ID: Nicholas Aran., male    DOB: December 12, 1955, 63 y.o.   MRN: [id number] Chief Complaint  Patient presents with   New Patient (Initial Visit)    HPI  Nicholas Boortz. is a 63 y.o. male The patient is seen for evaluation of symptomatic varicose veins. The patient relates burning and stinging which worsened steadily throughout the course of the day, particularly with standing. The patient also notes an aching and throbbing pain over the varicosities, particularly with prolonged dependent positions. The symptoms are significantly improved with elevation.  The patient also notes that during hot weather the symptoms are greatly intensified. The patient states the pain from the varicose veins interferes with work, daily exercise, shopping and household maintenance. At this point, the symptoms are persistent and severe enough that they're having a negative impact on lifestyle and are interfering with daily activities.  There is no history of DVT, PE or superficial thrombophlebitis. There is no history of ulceration or hemorrhage. The patient denies a significant family history of varicose veins.  The patient has worn graduated compression in the past, but not consistently.  At the present time the patient has not been using over-the-counter analgesics. There is no history of prior surgical intervention or sclerotherapy.  The patient has reflux within the bilateral femoral veins as well as right popliteal vein.  The patient also has reflux bilaterally in the great saphenous veins.  He has no evidence of DVT or superficial venous thrombosis bilaterally.  Past Medical History:  Diagnosis Date   Abnormal ultrasound of prostate November 07, 2014   Acute back pain with sciatica    Midline Low back- Right-side   Arthritis    Lumbosacral DDD and  Rheumatoid   Cancer ([place]) 2017   PROSTATE   Chronic kidney disease    H/O KIDNEY STONES   Collagen vascular  disease ([place])    Lupus   Diabetes mellitus without complication ([place])    Type II; pt stated it was due to steroid injections he was given-GLUCOSE NORMALIZED AFTER THAT   Enlarged prostate    GERD (gastroesophageal reflux disease)    Glaucoma    Bilateral   History of kidney stones    Hypertension    Lupus ([place])     Past Surgical History:  Procedure Laterality Date   ABDOMINAL EXPOSURE N/A 11/09/2014   Procedure: ABDOMINAL EXPOSURE;  Surgeon: Angelia Mould, MD;  Location: [place];  Service: Vascular;  Laterality: N/A;   ANTERIOR LUMBAR FUSION N/A 11/09/2014   Procedure: ANTERIOR LUMBAR FUSION 1 LEVEL L5 - S1;  Surgeon: Melina Schools, MD;  Location: [place];  Service: Orthopedics;  Laterality: N/A;   COLONOSCOPY W/ POLYPECTOMY     EXTRACORPOREAL SHOCK WAVE LITHOTRIPSY Right 11/20/2016   Procedure: EXTRACORPOREAL SHOCK WAVE LITHOTRIPSY (ESWL);  Surgeon: Royston Cowper, MD;  Location: [facility];  Service: Urology;  Laterality: Right;   EXTRACORPOREAL SHOCK WAVE LITHOTRIPSY Right 12/04/2016   Procedure: EXTRACORPOREAL SHOCK WAVE LITHOTRIPSY (ESWL);  Surgeon: Royston Cowper, MD;  Location: [facility];  Service: Urology;  Laterality: Right;   FOOT SURGERY Bilateral    GREEN LIGHT LASER TURP (TRANSURETHRAL RESECTION OF PROSTATE N/A 01/30/2015   Procedure: GREEN LIGHT LASER TURP (TRANSURETHRAL RESECTION OF PROSTATE;  Surgeon: Royston Cowper, MD;  Location: [facility];  Service: Urology;  Laterality: N/A;   PROSTATE BIOPSY  October 2016   TRANSURETHRAL RESECTION OF BLADDER TUMOR N/A 02/24/2017   Procedure: TRANSURETHRAL RESECTION OF  BLADDER TUMOR (TURBT);  Surgeon: Royston Cowper, MD;  Location: [facility];  Service: Urology;  Laterality: N/A;   TUMOR REMOVAL      Social History   Socioeconomic History   Marital status: Married    Spouse name: Not on file   Number of children: Not on file   Years of education: Not on file   Highest education level: Not on file    Occupational History   Not on file  Social Needs   Financial resource strain: Not on file   Food insecurity    Worry: Not on file    Inability: Not on file   Transportation needs    Medical: Not on file    Non-medical: Not on file  Tobacco Use   Smoking status: Former Smoker    Packs/day: 0.25    Types: Cigarettes    Quit date: 01/21/1994    Years since quitting: 24.7   Smokeless tobacco: Never Used   Tobacco comment: SOCIAL SMOKER ONLY  Substance and Sexual Activity   Alcohol use: No    Alcohol/week: 0.0 standard drinks   Drug use: No   Sexual activity: Not on file  Lifestyle   Physical activity    Days per week: Not on file    Minutes per session: Not on file   Stress: Not on file  Relationships   Social connections    Talks on phone: Not on file    Gets together: Not on file    Attends religious service: Not on file    Active member of club or organization: Not on file    Attends meetings of clubs or organizations: Not on file    Relationship status: Not on file   Intimate partner violence    Fear of current or ex partner: Not on file    Emotionally abused: Not on file    Physically abused: Not on file    Forced sexual activity: Not on file  Other Topics Concern   Not on file  Social History Narrative   Not on file    Family History  Problem Relation Age of Onset   Diabetes Mother    Varicose Veins Mother    Cancer Father     Allergies  Allergen Reactions   Ciprofloxacin Itching   Levofloxacin Itching    Other reaction(s): Other (See Comments) Extreme itching and his eyes felt like they had sand in them.   Sulfa Antibiotics Other (See Comments)    FATIGUE     Review of Systems   Review of Systems: Negative Unless Checked Constitutional: [] Weight loss  [] Fever  [] Chills Cardiac: [] Chest pain   []  Atrial Fibrillation  [] Palpitations   [] Shortness of breath when laying flat   [] Shortness of breath with exertion. [] Shortness  of breath at rest Vascular:  [] Pain in legs with walking   [] Pain in legs with standing [] Pain in legs when laying flat   [] Claudication    [] Pain in feet when laying flat    [] History of DVT   [] Phlebitis   [x] Swelling in legs   [x] Varicose veins   [] Non-healing ulcers Pulmonary:   [] Uses home oxygen   [] Productive cough   [] Hemoptysis   [] Wheeze  [] COPD   [] Asthma Neurologic:  [] Dizziness   [] Seizures  [] Blackouts [] History of stroke   [] History of TIA  [] Aphasia   [] Temporary Blindness   [] Weakness or numbness in arm   [] Weakness or numbness in leg Musculoskeletal:   [] Joint swelling   []   Joint pain   [] Low back pain  []  History of Knee Replacement [x] Arthritis [] back Surgeries  []  Spinal Stenosis    Hematologic:  [] Easy bruising  [] Easy bleeding   [] Hypercoagulable state   [] Anemic Gastrointestinal:  [] Diarrhea   [] Vomiting  [x] Gastroesophageal reflux/heartburn   [] Difficulty swallowing. [] Abdominal pain Genitourinary:  [] Chronic kidney disease   [] Difficult urination  [] Anuric   [] Blood in urine [] Frequent urination  [] Burning with urination   [] Hematuria Skin:  [] Rashes   [] Ulcers [] Wounds Psychological:  [] History of anxiety   []  History of major depression  []  Memory Difficulties      OBJECTIVE:   Physical Exam  BP (!) 146/83 (BP Location: Left Arm, Patient Position: Sitting, Cuff Size: Normal)    Pulse 82    Resp 10    Ht 5\' 11"  (1.803 m)    Wt 209 lb (94.8 kg)    BMI 29.15 kg/m   Gen: WD/WN, NAD Head: [place]/AT, No temporalis wasting.  Ear/Nose/Throat: Hearing grossly intact, nares w/o erythema or drainage Eyes: PER, EOMI, sclera nonicteric.  Neck: Supple, no masses.  No JVD.  Pulmonary:  Good air movement, no use of accessory muscles.  Cardiac: RRR Vascular: scattered varicosities present bilaterally.  Mild venous stasis changes to the legs bilaterally.  4-5 mm varicosities bilateral with scattered spider veins  Vessel Right Left  Radial Palpable Palpable  Dorsalis Pedis Palpable  Palpable  Posterior Tibial Palpable Palpable   Gastrointestinal: soft, non-distended. No guarding/no peritoneal signs.  Musculoskeletal: M/S 5/5 throughout.  No deformity or atrophy.  Neurologic: Pain and light touch intact in extremities.  Symmetrical.  Speech is fluent. Motor exam as listed above. Psychiatric: Judgment intact, Mood & affect appropriate for pt's clinical situation. Dermatologic: No Venous rashes. No Ulcers Noted.  No changes consistent with cellulitis. Lymph : No Cervical lymphadenopathy, no lichenification or skin changes of chronic lymphedema.       ASSESSMENT AND PLAN:  1. Varicose veins of bilateral lower extremities with pain  Recommend:  The patient has large symptomatic varicose veins that are painful and associated with swelling.  I have had a long discussion with the patient regarding  varicose veins and why they cause symptoms.  Patient will begin wearing graduated compression stockings class 1 on a daily basis, beginning first thing in the morning and removing them in the evening. The patient is instructed specifically not to sleep in the stockings.    The patient  will also begin using over-the-counter analgesics such as Motrin 600 mg po TID to help control the symptoms.    In addition, behavioral modification including elevation during the day will be initiated.    Pending the results of these changes the  patient will be reevaluated in three months.    Further plans will be based on the ultrasound results and whether conservative therapies are successful at eliminating the pain and swelling.   2. Essential hypertension Continue antihypertensive medications as already ordered, these medications have been reviewed and there are no changes at this time.   3. Type 2 diabetes mellitus with chronic kidney disease, without long-term current use of insulin, unspecified CKD stage ([place]) Continue hypoglycemic medications as already ordered, these medications  have been reviewed and there are no changes at this time.  Hgb A1C to be monitored as already arranged by primary service    Current Outpatient Medications on File Prior to Visit  Medication Sig Dispense Refill   brimonidine (ALPHAGAN) 0.2 % ophthalmic solution Place 1 drop into the  left eye 2 (two) times daily.      Dorzolamide HCl-Timolol Mal PF 22.3-6.8 MG/ML SOLN Place 1 drop into both eyes 2 (two) times daily.      gabapentin (NEURONTIN) 100 MG capsule Take 100 mg by mouth at bedtime.      HYDROcodone-acetaminophen (NORCO/VICODIN) 5-325 MG tablet Take 1-2 tablets by mouth every 6 (six) hours as needed for moderate pain. 20 tablet 0   hydroxychloroquine (PLAQUENIL) 200 MG tablet Take 200 mg by mouth 2 (two) times daily. Sulfate  5   lansoprazole (PREVACID) 15 MG capsule Take 15 mg by mouth every morning.     latanoprost (XALATAN) 0.005 % ophthalmic solution Place 1 drop into both eyes at bedtime.      Meth-Hyo-M Bl-Na Phos-Ph Sal (URIBEL) 118 MG CAPS Take 1 capsule (118 mg total) by mouth every 6 (six) hours as needed (dysuria). 40 capsule 3   propranolol (INDERAL) 40 MG tablet Take 40 mg by mouth 2 (two) times daily.     tamsulosin (FLOMAX) 0.4 MG CAPS capsule      tolterodine (DETROL LA) 4 MG 24 hr capsule Take by mouth daily.     topiramate (TOPAMAX) 25 MG tablet Take 25 mg by mouth 2 (two) times daily.     No current facility-administered medications on file prior to visit.     There are no Patient Instructions on file for this visit. No follow-ups on file.   Kris Hartmann, NP  This note was completed with Sales executive.  Any errors are purely unintentional.

## 2018-10-12 ENCOUNTER — Ambulatory Visit
Admission: RE | Admit: 2018-10-12 | Discharge: 2018-10-12 | Disposition: A | Discharge status: Home / Self Care | Payer: Commercial Managed Care - PPO | Source: Ambulatory Visit | Attending: Internal Medicine | Admitting: Internal Medicine

## 2018-10-12 DIAGNOSIS — M5430 Sciatica, unspecified side: Secondary | ICD-10-CM | POA: Diagnosis present

## 2018-10-12 DIAGNOSIS — M549 Dorsalgia, unspecified: Secondary | ICD-10-CM | POA: Diagnosis not present

## 2018-10-12 MED ORDER — GADOBUTROL 1 MMOL/ML IV SOLN
10.0000 mL | Freq: Once | INTRAVENOUS | Status: AC | PRN
  Administered 2018-10-12: 16:00:00 10 mL via INTRAVENOUS

## 2018-11-13 ENCOUNTER — Emergency Department
Admission: EM | Admit: 2018-11-13 | Discharge: 2018-11-13 | Disposition: A | Discharge status: Home / Self Care | Payer: Commercial Managed Care - PPO | Attending: Emergency Medicine | Admitting: Emergency Medicine

## 2018-11-13 ENCOUNTER — Other Ambulatory Visit: Payer: Self-pay

## 2018-11-13 DIAGNOSIS — K641 Second degree hemorrhoids: Secondary | ICD-10-CM | POA: Insufficient documentation

## 2018-11-13 DIAGNOSIS — N189 Chronic kidney disease, unspecified: Secondary | ICD-10-CM | POA: Insufficient documentation

## 2018-11-13 DIAGNOSIS — Z79899 Other long term (current) drug therapy: Secondary | ICD-10-CM | POA: Insufficient documentation

## 2018-11-13 DIAGNOSIS — Z8546 Personal history of malignant neoplasm of prostate: Secondary | ICD-10-CM | POA: Insufficient documentation

## 2018-11-13 DIAGNOSIS — Z87891 Personal history of nicotine dependence: Secondary | ICD-10-CM | POA: Diagnosis not present

## 2018-11-13 DIAGNOSIS — E1122 Type 2 diabetes mellitus with diabetic chronic kidney disease: Secondary | ICD-10-CM | POA: Insufficient documentation

## 2018-11-13 DIAGNOSIS — K6289 Other specified diseases of anus and rectum: Secondary | ICD-10-CM | POA: Diagnosis present

## 2018-11-13 LAB — LIPASE, BLOOD: Lipase: 19 U/L (ref 11–51)

## 2018-11-13 LAB — URINALYSIS, COMPLETE (UACMP) WITH MICROSCOPIC
Bacteria, UA: NONE SEEN
Bilirubin Urine: NEGATIVE
Glucose, UA: NEGATIVE mg/dL
Hgb urine dipstick: NEGATIVE
Ketones, ur: NEGATIVE mg/dL
Leukocytes,Ua: NEGATIVE
Nitrite: NEGATIVE
Protein, ur: 30 mg/dL — AB
Specific Gravity, Urine: 1.016 (ref 1.005–1.030)
pH: 5 (ref 5.0–8.0)

## 2018-11-13 LAB — CBC
HCT: 34.3 % — ABNORMAL LOW (ref 39.0–52.0)
Hemoglobin: 11.3 g/dL — ABNORMAL LOW (ref 13.0–17.0)
MCH: 31.3 pg (ref 26.0–34.0)
MCHC: 32.9 g/dL (ref 30.0–36.0)
MCV: 95 fL (ref 80.0–100.0)
Platelets: 235 10*3/uL (ref 150–400)
RBC: 3.61 MIL/uL — ABNORMAL LOW (ref 4.22–5.81)
RDW: 12.4 % (ref 11.5–15.5)
WBC: 8 10*3/uL (ref 4.0–10.5)
nRBC: 0 % (ref 0.0–0.2)

## 2018-11-13 LAB — COMPREHENSIVE METABOLIC PANEL
ALT: 16 U/L (ref 0–44)
AST: 20 U/L (ref 15–41)
Albumin: 3.6 g/dL (ref 3.5–5.0)
Alkaline Phosphatase: 74 U/L (ref 38–126)
Anion gap: 9 (ref 5–15)
BUN: 15 mg/dL (ref 8–23)
CO2: 23 mmol/L (ref 22–32)
Calcium: 8.8 mg/dL — ABNORMAL LOW (ref 8.9–10.3)
Chloride: 109 mmol/L (ref 98–111)
Creatinine, Ser: 1.02 mg/dL (ref 0.61–1.24)
GFR calc Af Amer: >60 mL/min (ref >60)
GFR calc non Af Amer: >60 mL/min (ref >60)
Glucose, Bld: 134 mg/dL — ABNORMAL HIGH (ref 70–99)
Potassium: 4 mmol/L (ref 3.5–5.1)
Sodium: 141 mmol/L (ref 135–145)
Total Bilirubin: 0.7 mg/dL (ref 0.3–1.2)
Total Protein: 8.1 g/dL (ref 6.5–8.1)

## 2018-11-13 MED ORDER — LIDOCAINE-PRILOCAINE 2.5-2.5 % EX CREA: 1.0000 "application " | TOPICAL_CREAM | CUTANEOUS | 0 refills | Status: DC | PRN

## 2018-11-13 MED ORDER — HYDROCORTISONE ACETATE 25 MG RE SUPP: 25.0000 mg | Freq: Two times a day (BID) | RECTAL | 1 refills | Status: AC

## 2018-11-13 NOTE — Discharge Instructions (Addendum)
Follow discharge care instructions and continue previous medication.  Follow-up with schedule appointment with gastroenterology.

## 2018-11-13 NOTE — ED Notes (Signed)
Pt verbalized understanding of discharge instructions. NAD at this time. 

## 2018-11-13 NOTE — ED Triage Notes (Signed)
Pt to the er for abd pain and rectal pain. Pt has a GI hx and was seen recently.

## 2018-11-13 NOTE — ED Notes (Signed)
Pt states abdominal and rectal pain for several weeks. Pt states GI doctor prescribed meds without relief. Pt states has follow up but unable to take the pain. Pt denies NVD. Pt states able to eat/drink normally.

## 2018-11-13 NOTE — ED Provider Notes (Signed)
[facility] Emergency Department Provider Note   ____________________________________________   First MD Initiated Contact with Patient 11/13/18 [phone]     (approximate)  I have reviewed the triage vital signs and the nursing notes.   HISTORY  Chief Complaint Abdominal Pain    HPI Nicholas Vazquez. is a 63 y.o. male presents with rectal pain secondary to hemorrhoids and constipation.  Patient stated no relief with steroidal cream prescribed by gastroenterologist 3 weeks ago.  Patient is scheduled to see gastroenterologist in 4 days.  Patient requests pain relief for hemorrhoids.  Patient states he has a prescription for laxative which gives temporary relief for his constipation.  Patient denies rectal bleeding.  Last bowel movement was yesterday.  Rates his rectal pain is a 10/10.  Patient described pain as "achy".         Past Medical History:  Diagnosis Date  . Abnormal ultrasound of prostate November 07, 2014  . Acute back pain with sciatica    Midline Low back- Right-side  . Arthritis    Lumbosacral DDD and  Rheumatoid  . Cancer ([place]) 2017   PROSTATE  . Chronic kidney disease    H/O KIDNEY STONES  . Collagen vascular disease ([facility])    Lupus  . Diabetes mellitus without complication ([place])    Type II; pt stated it was due to steroid injections he was given-GLUCOSE NORMALIZED AFTER THAT  . Enlarged prostate   . GERD (gastroesophageal reflux disease)   . Glaucoma    Bilateral  . History of kidney stones   . Hypertension   . Lupus [facility])     Patient Active Problem List   Diagnosis Date Noted  . Headache disorder 03/30/2018  . Carcinoma of prostate ([place]) 03/23/2018  . Hypertensive disorder 03/23/2018  . OA (osteoarthritis) 01/13/2017  . Cancer of prostate ([place]) 03/27/2015  . Health care maintenance 03/27/2015  . Back pain with sciatica 11/09/2014  . Glaucoma 04/11/2013  . Iritis 04/11/2013  . Kidney stones 04/11/2013  . Lipoma of  scalp 04/11/2013  . Lipoma of shoulder 04/11/2013  . Lupus ([place]) 04/11/2013  . Type 2 diabetes mellitus with chronic kidney disease ([place]) 04/11/2013    Past Surgical History:  Procedure Laterality Date  . ABDOMINAL EXPOSURE N/A 11/09/2014   Procedure: ABDOMINAL EXPOSURE;  Surgeon: Angelia Mould, MD;  Location: [place];  Service: Vascular;  Laterality: N/A;  . ANTERIOR LUMBAR FUSION N/A 11/09/2014   Procedure: ANTERIOR LUMBAR FUSION 1 LEVEL L5 - S1;  Surgeon: Melina Schools, MD;  Location: [place];  Service: Orthopedics;  Laterality: N/A;  . COLONOSCOPY W/ POLYPECTOMY    . EXTRACORPOREAL SHOCK WAVE LITHOTRIPSY Right 11/20/2016   Procedure: EXTRACORPOREAL SHOCK WAVE LITHOTRIPSY (ESWL);  Surgeon: Royston Cowper, MD;  Location: [facility];  Service: Urology;  Laterality: Right;  . EXTRACORPOREAL SHOCK WAVE LITHOTRIPSY Right 12/04/2016   Procedure: EXTRACORPOREAL SHOCK WAVE LITHOTRIPSY (ESWL);  Surgeon: Royston Cowper, MD;  Location: [facility];  Service: Urology;  Laterality: Right;  . FOOT SURGERY Bilateral   . GREEN LIGHT LASER TURP (TRANSURETHRAL RESECTION OF PROSTATE N/A 01/30/2015   Procedure: GREEN LIGHT LASER TURP (TRANSURETHRAL RESECTION OF PROSTATE;  Surgeon: Royston Cowper, MD;  Location: [facility];  Service: Urology;  Laterality: N/A;  . PROSTATE BIOPSY  October 2016  . TRANSURETHRAL RESECTION OF BLADDER TUMOR N/A 02/24/2017   Procedure: TRANSURETHRAL RESECTION OF BLADDER TUMOR (TURBT);  Surgeon: Royston Cowper, MD;  Location: [facility];  Service: Urology;  Laterality: N/A;  . TUMOR REMOVAL      Prior to Admission medications   Medication Sig Start Date End Date Taking? Authorizing Provider  brimonidine (ALPHAGAN) 0.2 % ophthalmic solution Place 1 drop into the left eye 2 (two) times daily.     [provider]  Dorzolamide HCl-Timolol Mal PF 22.3-6.8 MG/ML SOLN Place 1 drop into both eyes 2 (two) times daily.     [provider]  gabapentin (NEURONTIN) 100 MG  capsule Take 100 mg by mouth at bedtime.     [provider]  HYDROcodone-acetaminophen (NORCO/VICODIN) 5-325 MG tablet Take 1-2 tablets by mouth every 6 (six) hours as needed for moderate pain. 02/24/17   Royston Cowper, MD  hydrocortisone (ANUSOL-HC) 25 MG suppository Place 1 suppository (25 mg total) rectally every 12 (twelve) hours. 11/13/18 11/13/19  Sable Feil, PA-C  hydroxychloroquine (PLAQUENIL) 200 MG tablet Take 200 mg by mouth 2 (two) times daily. Sulfate 09/30/14   [provider]  lansoprazole (PREVACID) 15 MG capsule Take 15 mg by mouth every morning.    [provider]  latanoprost (XALATAN) 0.005 % ophthalmic solution Place 1 drop into both eyes at bedtime.     [provider]  lidocaine-prilocaine (EMLA) cream Apply 1 application topically as needed. 11/13/18   Sable Feil, PA-C  Meth-Hyo-M Bl-Na Phos-Ph Sal (URIBEL) 118 MG CAPS Take 1 capsule (118 mg total) by mouth every 6 (six) hours as needed (dysuria). 02/24/17   Royston Cowper, MD  propranolol (INDERAL) 40 MG tablet Take 40 mg by mouth 2 (two) times daily. 09/20/18   [provider]  tamsulosin (FLOMAX) 0.4 MG CAPS capsule  10/02/18   [provider]  tolterodine (DETROL LA) 4 MG 24 hr capsule Take by mouth daily. 09/20/18   [provider]  topiramate (TOPAMAX) 25 MG tablet Take 25 mg by mouth 2 (two) times daily. 06/13/18   [provider]    Allergies Ciprofloxacin, Levofloxacin, and Sulfa antibiotics  Family History  Problem Relation Age of Onset  . Diabetes Mother   . Varicose Veins Mother   . Cancer Father     Social History Social History   Tobacco Use  . Smoking status: Former Smoker    Packs/day: 0.25    Types: Cigarettes    Quit date: 01/21/1994    Years since quitting: 24.8  . Smokeless tobacco: Never Used  . Tobacco comment: SOCIAL SMOKER ONLY  Substance Use Topics  . Alcohol use: No    Alcohol/week: 0.0 standard drinks  .  Drug use: No    Review of Systems Constitutional: No fever/chills Eyes: No visual changes. ENT: No sore throat. Cardiovascular: Denies chest pain. Respiratory: Denies shortness of breath. Gastrointestinal: No abdominal pain.  No nausea, no vomiting.  No diarrhea.  Constipation and rectal hemorrhoids. Genitourinary: Negative for dysuria.  Prostate cancer. Musculoskeletal: Negative for back pain. Skin: Negative for rash. Neurological: Negative for headaches, focal weakness or numbness. Endocrine:  Diabetes and hypertension Hematological/Lymphatic:  Lupus  allergic/Immunilogical: Levaquin, Cipro, and sulfur.  ____________________________________________   PHYSICAL EXAM:  VITAL SIGNS: ED Triage Vitals  Enc Vitals Group     BP 11/13/18 0633 127/68     Pulse Rate 11/13/18 0633 86     Resp 11/13/18 0633 16     Temp 11/13/18 0633 98 F (36.7 C)     Temp Source 11/13/18 0633 Oral     SpO2 11/13/18 0633 99 %     Weight 11/13/18  0627 210 lb (95.3 kg)     Height 11/13/18 0627 5\' 11"  (1.803 m)     Head Circumference --      Peak Flow --      Pain Score 11/13/18 0627 10     Pain Loc --      Pain Edu? --      Excl. in [place]? --    Constitutional: Alert and oriented. Well appearing and in no acute distress. Cardiovascular: Normal rate, regular rhythm. Grossly normal heart sounds.  Good peripheral circulation. Respiratory: Normal respiratory effort.  No retractions. Lungs CTAB. Gastrointestinal: Soft and nontender. No distention. No abdominal bruits. No CVA tenderness.  Nonthrombosed hemorrhoids. Musculoskeletal: No lower extremity tenderness nor edema.  No joint effusions. Neurologic:  Normal speech and language. No gross focal neurologic deficits are appreciated. No gait instability. Skin:  Skin is warm, dry and intact. No rash noted. Psychiatric: Mood and affect are normal. Speech and behavior are normal.  ____________________________________________   LABS (all labs ordered are  listed, but only abnormal results are displayed)  Labs Reviewed  COMPREHENSIVE METABOLIC PANEL - Abnormal; Notable for the following components:      Result Value   Glucose, Bld 134 (*)    Calcium 8.8 (*)    All other components within normal limits  CBC - Abnormal; Notable for the following components:   RBC 3.61 (*)    Hemoglobin 11.3 (*)    HCT 34.3 (*)    All other components within normal limits  URINALYSIS, COMPLETE (UACMP) WITH MICROSCOPIC - Abnormal; Notable for the following components:   Color, Urine GREEN (*)    APPearance CLEAR (*)    Protein, ur 30 (*)    All other components within normal limits  LIPASE, BLOOD   ____________________________________________  EKG   ____________________________________________  RADIOLOGY  ED MD interpretation:    Official radiology report(s): No results found.  ____________________________________________   PROCEDURES  Procedure(s) performed (including Critical Care):  Procedures   ____________________________________________   INITIAL IMPRESSION / ASSESSMENT AND PLAN / ED COURSE  As part of my medical decision making, I reviewed the following data within the [facility]. was evaluated in Emergency Department on 11/13/2018 for the symptoms described in the history of present illness. He was evaluated in the context of the global COVID-19 pandemic, which necessitated consideration that the patient might be at risk for infection with the SARS-CoV-2 virus that causes COVID-19. Institutional protocols and algorithms that pertain to the evaluation of patients at risk for COVID-19 are in a state of rapid change based on information released by regulatory bodies including the CDC and federal and state organizations. These policies and algorithms were followed during the patient's care in the ED.  Patient presents with abdominal pain secondary to constipation and hemorrhoids.   Patient stated no relief with steroidal cream.  Patient given prescription for Anusol and EMLA to use until reevaluation by gastroenterologist on 11/16/2018.      ____________________________________________   FINAL CLINICAL IMPRESSION(S) / ED DIAGNOSES  Final diagnoses:  Grade II hemorrhoids     ED Discharge Orders         Ordered    hydrocortisone (ANUSOL-HC) 25 MG suppository  Every 12 hours     11/13/18 0806    lidocaine-prilocaine (EMLA) cream  As needed     11/13/18 0806           Note:  This document  was prepared using Systems analyst and may include unintentional dictation errors.    Sable Feil, PA-C 11/13/18 0818    Carrie Mew, MD 11/13/18 [phone]

## 2018-11-18 DIAGNOSIS — K644 Residual hemorrhoidal skin tags: Secondary | ICD-10-CM | POA: Insufficient documentation

## 2018-11-18 DIAGNOSIS — K640 First degree hemorrhoids: Secondary | ICD-10-CM | POA: Insufficient documentation

## 2018-12-14 ENCOUNTER — Other Ambulatory Visit: Payer: Self-pay | Admitting: Urology

## 2018-12-14 ENCOUNTER — Other Ambulatory Visit (HOSPITAL_COMMUNITY): Payer: Self-pay | Admitting: Urology

## 2018-12-14 DIAGNOSIS — R1031 Right lower quadrant pain: Secondary | ICD-10-CM

## 2018-12-14 DIAGNOSIS — R319 Hematuria, unspecified: Secondary | ICD-10-CM

## 2018-12-28 ENCOUNTER — Ambulatory Visit
Admission: RE | Admit: 2018-12-28 | Discharge: 2018-12-28 | Disposition: A | Discharge status: Home / Self Care | Payer: Commercial Managed Care - PPO | Source: Ambulatory Visit | Attending: Urology | Admitting: Urology

## 2018-12-28 ENCOUNTER — Other Ambulatory Visit: Payer: Self-pay

## 2018-12-28 DIAGNOSIS — R319 Hematuria, unspecified: Secondary | ICD-10-CM | POA: Insufficient documentation

## 2018-12-28 DIAGNOSIS — R1031 Right lower quadrant pain: Secondary | ICD-10-CM | POA: Insufficient documentation

## 2018-12-28 LAB — POCT I-STAT CREATININE: Creatinine, Ser: 1.1 mg/dL (ref 0.61–1.24)

## 2018-12-28 MED ORDER — IOHEXOL 300 MG/ML  SOLN
125.0000 mL | Freq: Once | INTRAMUSCULAR | Status: AC | PRN
  Administered 2018-12-28: 125 mL via INTRAVENOUS

## 2019-01-11 ENCOUNTER — Ambulatory Visit (INDEPENDENT_AMBULATORY_CARE_PROVIDER_SITE_OTHER): Payer: Commercial Managed Care - PPO | Admitting: Nurse Practitioner

## 2019-01-12 ENCOUNTER — Other Ambulatory Visit: Payer: Self-pay

## 2019-01-12 ENCOUNTER — Encounter (INDEPENDENT_AMBULATORY_CARE_PROVIDER_SITE_OTHER): Payer: Self-pay | Admitting: Nurse Practitioner

## 2019-01-12 ENCOUNTER — Ambulatory Visit (INDEPENDENT_AMBULATORY_CARE_PROVIDER_SITE_OTHER): Payer: Commercial Managed Care - PPO | Admitting: Nurse Practitioner

## 2019-01-12 VITALS — BP 144/86 | HR 81 | Resp 16 | Wt 213.4 lb

## 2019-01-12 DIAGNOSIS — I83813 Varicose veins of bilateral lower extremities with pain: Secondary | ICD-10-CM

## 2019-01-12 DIAGNOSIS — I10 Essential (primary) hypertension: Secondary | ICD-10-CM | POA: Diagnosis not present

## 2019-01-12 DIAGNOSIS — E1122 Type 2 diabetes mellitus with diabetic chronic kidney disease: Secondary | ICD-10-CM

## 2019-01-12 NOTE — Progress Notes (Signed)
SUBJECTIVE:  Patient ID: Nicholas Vazquez., male    DOB: [birth year], 64 y.o.   MRN: [id number] Chief Complaint  Patient presents with  . Follow-up    38month follow up    HPI  Nicholas Vazquez. is a 64 y.o. male the presents today for follow-up regarding his varicose veins.  Previously the patient was found to have venous reflux in the bilateral femoral veins as well as the right popliteal vein.  He also has reflux bilaterally in the great saphenous veins.  Since his last visit the patient has been diligent with wearing compression socks on a daily basis.  He states that his legs feel better when he is wearing the compression socks compared to days where he may forget to put them on.  He has also been good about placing them in the morning and removing them before he goes to bed at night.  He also endorses elevating his lower extremities as much as possible.  The patient also has a active profession so he would estimate that he gets about 30 minutes or so if not more of exercise most days of the week.  He denies any fever, chills, nausea, vomiting or diarrhea.  He denies any chest pain or shortness of breath.  He denies any cellulitis-like symptoms.  He denies any venous ulcerations or wounds of the bilateral lower extremities.  Past Medical History:  Diagnosis Date  . Abnormal ultrasound of prostate November 07, 2014  . Acute back pain with sciatica    Midline Low back- Right-side  . Arthritis    Lumbosacral DDD and  Rheumatoid  . Cancer ([place]) 2017   PROSTATE  . Chronic kidney disease    H/O KIDNEY STONES  . Collagen vascular disease (HCC)    Lupus  . Diabetes mellitus without complication ([place])    Type II; pt stated it was due to steroid injections he was given-GLUCOSE NORMALIZED AFTER THAT  . Enlarged prostate   . GERD (gastroesophageal reflux disease)   . Glaucoma    Bilateral  . History of kidney stones   . Hypertension   . Lupus [facility])     Past Surgical History:    Procedure Laterality Date  . ABDOMINAL EXPOSURE N/A 11/09/2014   Procedure: ABDOMINAL EXPOSURE;  Surgeon: Angelia Mould, MD;  Location: [place];  Service: Vascular;  Laterality: N/A;  . ANTERIOR LUMBAR FUSION N/A 11/09/2014   Procedure: ANTERIOR LUMBAR FUSION 1 LEVEL L5 - S1;  Surgeon: Melina Schools, MD;  Location: [place];  Service: Orthopedics;  Laterality: N/A;  . COLONOSCOPY W/ POLYPECTOMY    . EXTRACORPOREAL SHOCK WAVE LITHOTRIPSY Right 11/20/2016   Procedure: EXTRACORPOREAL SHOCK WAVE LITHOTRIPSY (ESWL);  Surgeon: Royston Cowper, MD;  Location: [facility];  Service: Urology;  Laterality: Right;  . EXTRACORPOREAL SHOCK WAVE LITHOTRIPSY Right 12/04/2016   Procedure: EXTRACORPOREAL SHOCK WAVE LITHOTRIPSY (ESWL);  Surgeon: Royston Cowper, MD;  Location: [facility];  Service: Urology;  Laterality: Right;  . FOOT SURGERY Bilateral   . GREEN LIGHT LASER TURP (TRANSURETHRAL RESECTION OF PROSTATE N/A 01/30/2015   Procedure: GREEN LIGHT LASER TURP (TRANSURETHRAL RESECTION OF PROSTATE;  Surgeon: Royston Cowper, MD;  Location: [facility];  Service: Urology;  Laterality: N/A;  . PROSTATE BIOPSY  October 2016  . TRANSURETHRAL RESECTION OF BLADDER TUMOR N/A 02/24/2017   Procedure: TRANSURETHRAL RESECTION OF BLADDER TUMOR (TURBT);  Surgeon: Royston Cowper, MD;  Location: [facility];  Service: Urology;  Laterality: N/A;  .  TUMOR REMOVAL      Social History   Socioeconomic History  . Marital status: Married    Spouse name: Not on file  . Number of children: Not on file  . Years of education: Not on file  . Highest education level: Not on file  Occupational History  . Not on file  Tobacco Use  . Smoking status: Former Smoker    Packs/day: 0.25    Types: Cigarettes    Quit date: 01/21/1994    Years since quitting: 24.9  . Smokeless tobacco: Never Used  . Tobacco comment: SOCIAL SMOKER ONLY  Substance and Sexual Activity  . Alcohol use: No    Alcohol/week: 0.0 standard drinks  . Drug use: No   . Sexual activity: Not on file  Other Topics Concern  . Not on file  Social History Narrative  . Not on file   Social Determinants of Health   Financial Resource Strain:   . Difficulty of Paying Living Expenses: Not on file  Food Insecurity:   . Worried About Charity fundraiser in the Last Year: Not on file  . Ran Out of Food in the Last Year: Not on file  Transportation Needs:   . Lack of Transportation (Medical): Not on file  . Lack of Transportation (Non-Medical): Not on file  Physical Activity:   . Days of Exercise per Week: Not on file  . Minutes of Exercise per Session: Not on file  Stress:   . Feeling of Stress : Not on file  Social Connections:   . Frequency of Communication with Friends and Family: Not on file  . Frequency of Social Gatherings with Friends and Family: Not on file  . Attends Religious Services: Not on file  . Active Member of Clubs or Organizations: Not on file  . Attends Archivist Meetings: Not on file  . Marital Status: Not on file  Intimate Partner Violence:   . Fear of Current or Ex-Partner: Not on file  . Emotionally Abused: Not on file  . Physically Abused: Not on file  . Sexually Abused: Not on file    Family History  Problem Relation Age of Onset  . Diabetes Mother   . Varicose Veins Mother   . Cancer Father     Allergies  Allergen Reactions  . Ciprofloxacin Itching  . Levofloxacin Itching    Other reaction(s): Other (See Comments) Extreme itching and his eyes felt like they had sand in them.  . Sulfa Antibiotics Other (See Comments)    FATIGUE     Review of Systems   Review of Systems: Negative Unless Checked Constitutional: [] Weight loss  [] Fever  [] Chills Cardiac: [] Chest pain   []  Atrial Fibrillation  [] Palpitations   [] Shortness of breath when laying flat   [] Shortness of breath with exertion. [] Shortness of breath at rest Vascular:  [] Pain in legs with walking   [] Pain in legs with standing [] Pain in legs  when laying flat   [] Claudication    [] Pain in feet when laying flat    [] History of DVT   [] Phlebitis   [x] Swelling in legs   [x] Varicose veins   [] Non-healing ulcers Pulmonary:   [] Uses home oxygen   [] Productive cough   [] Hemoptysis   [] Wheeze  [] COPD   [] Asthma Neurologic:  [] Dizziness   [] Seizures  [] Blackouts [] History of stroke   [] History of TIA  [] Aphasia   [] Temporary Blindness   [] Weakness or numbness in arm   [] Weakness or numbness in  leg Musculoskeletal:   [] Joint swelling   [] Joint pain   [] Low back pain  []  History of Knee Replacement [x] Arthritis [] back Surgeries  []  Spinal Stenosis    Hematologic:  [] Easy bruising  [] Easy bleeding   [] Hypercoagulable state   [] Anemic Gastrointestinal:  [] Diarrhea   [] Vomiting  [x] Gastroesophageal reflux/heartburn   [] Difficulty swallowing. [] Abdominal pain Genitourinary:  [] Chronic kidney disease   [] Difficult urination  [] Anuric   [] Blood in urine [] Frequent urination  [] Burning with urination   [] Hematuria Skin:  [] Rashes   [] Ulcers [] Wounds Psychological:  [] History of anxiety   []  History of major depression  []  Memory Difficulties      OBJECTIVE:   Physical Exam  BP (!) 144/86 (BP Location: Right Arm)   Pulse 81   Resp 16   Wt 213 lb 6.4 oz (96.8 kg)   BMI 28.94 kg/m   Gen: WD/WN, NAD Head: [place]/AT, No temporalis wasting.  Ear/Nose/Throat: Hearing grossly intact, nares w/o erythema or drainage Eyes: PER, EOMI, sclera nonicteric.  Neck: Supple, no masses.  No JVD.  Pulmonary:  Good air movement, no use of accessory muscles.  Cardiac: RRR Vascular: scattered varicosities present bilaterally.  Mild venous stasis changes to the legs bilaterally.  Minimal edema, 4-5 mm varicosities bilaterally with scattered spider veins.  Vessel Right Left  Radial Palpable Palpable  Dorsalis Pedis Palpable Palpable  Posterior Tibial Palpable Palpable   Gastrointestinal: soft, non-distended. No guarding/no peritoneal signs.  Musculoskeletal: M/S  5/5 throughout.  No deformity or atrophy.  Neurologic: Pain and light touch intact in extremities.  Symmetrical.  Speech is fluent. Motor exam as listed above. Psychiatric: Judgment intact, Mood & affect appropriate for pt's clinical situation. Dermatologic: No Venous rashes. No Ulcers Noted.  No changes consistent with cellulitis. Lymph : No Cervical lymphadenopathy, no lichenification or skin changes of chronic lymphedema.       ASSESSMENT AND PLAN:  1. Varicose veins of bilateral lower extremities with pain Recommend:  The patient is complaining of varicose veins.    I have had a long discussion with the patient regarding  varicose veins and why they cause symptoms.  Patient will begin wearing graduated compression stockings on a daily basis, beginning first thing in the morning and removing them in the evening. The patient is instructed specifically not to sleep in the stockings.    The patient  will also begin using over-the-counter analgesics such as Motrin 600 mg po TID to help control the symptoms as needed.    In addition, behavioral modification including elevation during the day will be initiated, utilizing a recliner was recommended.  The patient is also instructed to continue exercising such as walking 4-5 times per week.  At this time the patient wishes to continue conservative therapy and is not interested in more invasive treatments such as laser ablation and sclerotherapy.  The Patient will follow up in 3 months to re assess progression with conservative therapy   2. Essential hypertension Continue antihypertensive medications as already ordered, these medications have been reviewed and there are no changes at this time.   3. Type 2 diabetes mellitus with chronic kidney disease, without long-term current use of insulin, unspecified CKD stage ([place]) Continue hypoglycemic medications as already ordered, these medications have been reviewed and there are no changes at this  time.  Hgb A1C to be monitored as already arranged by primary service    Current Outpatient Medications on File Prior to Visit  Medication Sig Dispense Refill  . brimonidine (ALPHAGAN) 0.2 %  ophthalmic solution Place 1 drop into the left eye 2 (two) times daily.     . ciprofloxacin (CIPRO) 500 MG tablet SMARTSIG:1 Tablet(s) By Mouth Every 12 Hours    . Dorzolamide HCl-Timolol Mal PF 22.3-6.8 MG/ML SOLN Place 1 drop into both eyes 2 (two) times daily.     Marland Kitchen gabapentin (NEURONTIN) 100 MG capsule Take 100 mg by mouth at bedtime.     . hydrocortisone (ANUSOL-HC) 25 MG suppository Place 1 suppository (25 mg total) rectally every 12 (twelve) hours. 12 suppository 1  . hydroxychloroquine (PLAQUENIL) 200 MG tablet Take 200 mg by mouth 2 (two) times daily. Sulfate  5  . lansoprazole (PREVACID) 15 MG capsule Take 15 mg by mouth every morning.    . latanoprost (XALATAN) 0.005 % ophthalmic solution Place 1 drop into both eyes at bedtime.     . lidocaine-prilocaine (EMLA) cream Apply 1 application topically as needed. 30 g 0  . LINZESS 72 MCG capsule Take 72 mcg by mouth daily.    . propranolol (INDERAL) 40 MG tablet Take 40 mg by mouth 2 (two) times daily.    . tamsulosin (FLOMAX) 0.4 MG CAPS capsule     . THERACRAN HP 180 MG CAPS     . tolterodine (DETROL LA) 4 MG 24 hr capsule Take by mouth daily.    Marland Kitchen topiramate (TOPAMAX) 25 MG tablet Take 25 mg by mouth 2 (two) times daily.    Marland Kitchen HYDROcodone-acetaminophen (NORCO/VICODIN) 5-325 MG tablet Take 1-2 tablets by mouth every 6 (six) hours as needed for moderate pain. (Patient not taking: Reported on 01/12/2019) 20 tablet 0  . Meth-Hyo-M Bl-Na Phos-Ph Sal (URIBEL) 118 MG CAPS Take 1 capsule (118 mg total) by mouth every 6 (six) hours as needed (dysuria). (Patient not taking: Reported on 01/12/2019) 40 capsule 3   No current facility-administered medications on file prior to visit.    There are no Patient Instructions on file for this visit. No follow-ups on  file.   Kris Hartmann, NP  This note was completed with Sales executive.  Any errors are purely unintentional.

## 2019-04-01 ENCOUNTER — Ambulatory Visit: Payer: Commercial Managed Care - PPO | Attending: Internal Medicine

## 2019-04-01 DIAGNOSIS — Z23 Encounter for immunization: Secondary | ICD-10-CM

## 2019-04-12 ENCOUNTER — Ambulatory Visit (INDEPENDENT_AMBULATORY_CARE_PROVIDER_SITE_OTHER): Payer: Commercial Managed Care - PPO | Admitting: Nurse Practitioner

## 2019-04-27 ENCOUNTER — Ambulatory Visit: Payer: Commercial Managed Care - PPO | Attending: Internal Medicine

## 2019-04-27 DIAGNOSIS — Z23 Encounter for immunization: Secondary | ICD-10-CM

## 2019-04-27 NOTE — Progress Notes (Signed)
   [id number]  Name:  Nicholas Vazquez.    MRN: [id number] DOB: 1955/10/12  04/27/2019  [name] was observed post Covid-19 immunization for 15 minutes without incident. He was provided with Vaccine Information Sheet and instruction to access the V-Safe system.   [name] was instructed to call 911 with any severe reactions post vaccine: Marland Kitchen Difficulty breathing  . Swelling of face and throat  . A fast heartbeat  . A bad rash all over body  . Dizziness and weakness   Immunizations Administered    Name Date Dose VIS Date Route   Pfizer COVID-19 Vaccine 04/27/2019  9:07 AM 0.3 mL 03/02/2018 Intramuscular   Manufacturer: [place]   Lot: [id number]   [place]: [id number]

## 2019-05-13 ENCOUNTER — Encounter (INDEPENDENT_AMBULATORY_CARE_PROVIDER_SITE_OTHER): Payer: Self-pay | Admitting: Nurse Practitioner

## 2019-06-02 ENCOUNTER — Ambulatory Visit: Payer: Commercial Managed Care - PPO | Admitting: Podiatry

## 2019-06-14 ENCOUNTER — Other Ambulatory Visit: Payer: Self-pay

## 2019-06-14 ENCOUNTER — Encounter: Payer: Self-pay | Admitting: Podiatry

## 2019-06-14 ENCOUNTER — Ambulatory Visit: Payer: Commercial Managed Care - PPO | Admitting: Podiatry

## 2019-06-14 DIAGNOSIS — E0843 Diabetes mellitus due to underlying condition with diabetic autonomic (poly)neuropathy: Secondary | ICD-10-CM

## 2019-06-14 DIAGNOSIS — B351 Tinea unguium: Secondary | ICD-10-CM

## 2019-06-14 DIAGNOSIS — M79676 Pain in unspecified toe(s): Secondary | ICD-10-CM

## 2019-06-14 DIAGNOSIS — L989 Disorder of the skin and subcutaneous tissue, unspecified: Secondary | ICD-10-CM

## 2019-06-14 NOTE — Progress Notes (Signed)
   SUBJECTIVE Patient with a history of diabetes mellitus presents to office today complaining of elongated, thickened nails that cause pain while ambulating in shoes. He is unable to trim his own nails.  Patient also complains of painful symptomatic callus lesions to the bilateral feet. He is unable to trim these calluses and he went for a pedicure but he states that they did not get the calluses off and they only smoothed it. Patient is here for further evaluation and treatment.   Past Medical History:  Diagnosis Date  . Abnormal ultrasound of prostate November 07, 2014  . Acute back pain with sciatica    Midline Low back- Right-side  . Arthritis    Lumbosacral DDD and  Rheumatoid  . Cancer ([place]) 2017   PROSTATE  . Chronic kidney disease    H/O KIDNEY STONES  . Collagen vascular disease (HCC)    Lupus  . Diabetes mellitus without complication ([place])    Type II; pt stated it was due to steroid injections he was given-GLUCOSE NORMALIZED AFTER THAT  . Enlarged prostate   . GERD (gastroesophageal reflux disease)   . Glaucoma    Bilateral  . History of kidney stones   . Hypertension   . Lupus ([place])     OBJECTIVE General Patient is awake, alert, and oriented x 3 and in no acute distress. Derm Skin is dry and supple bilateral. Negative open lesions or macerations. Remaining integument unremarkable. Nails are tender, long, thickened and dystrophic with subungual debris, consistent with onychomycosis, 1-5 bilateral. No signs of infection noted. Hyperkeratotic callus lesions noted bilateral great toes Vasc  DP and PT pedal pulses palpable bilaterally. Temperature gradient within normal limits.  Neuro Epicritic and protective threshold sensation diminished bilaterally.  Musculoskeletal Exam No symptomatic pedal deformities noted bilateral. Muscular strength within normal limits.  ASSESSMENT 1. Diabetes Mellitus w/ peripheral neuropathy 2. Onychomycosis of nail due to dermatophyte  bilateral 3. Preulcerative callus lesions bilateral feet  #4 pain in foot bilateral  PLAN OF CARE 1. Patient evaluated today. 2. Instructed to maintain good pedal hygiene and foot care. Stressed importance of controlling blood sugar.  3. Mechanical debridement of nails 1-5 bilaterally performed using a nail nipper. Filed with dremel without incident.  4. Excisional debridement of symptomatic callus lesions was performed using a chisel blade without incident or bleeding  #5 return to clinic in 3 mos.     Edrick Kins, DPM [facility]  Dr. Edrick Kins, [address]                                        [place], [place] [address]                Office [phone]  Fax [phone]

## 2019-08-09 ENCOUNTER — Other Ambulatory Visit: Payer: Self-pay | Admitting: Physical Medicine and Rehabilitation

## 2019-08-09 DIAGNOSIS — G8929 Other chronic pain: Secondary | ICD-10-CM

## 2019-09-01 ENCOUNTER — Other Ambulatory Visit: Payer: Self-pay | Admitting: Physical Medicine and Rehabilitation

## 2019-09-02 ENCOUNTER — Other Ambulatory Visit: Payer: Self-pay | Admitting: Physical Medicine and Rehabilitation

## 2019-09-04 ENCOUNTER — Ambulatory Visit
Admission: RE | Admit: 2019-09-04 | Discharge: 2019-09-04 | Disposition: A | Discharge status: Home / Self Care | Payer: Commercial Managed Care - PPO | Source: Ambulatory Visit | Attending: Physical Medicine and Rehabilitation | Admitting: Physical Medicine and Rehabilitation

## 2019-09-04 DIAGNOSIS — G8929 Other chronic pain: Secondary | ICD-10-CM

## 2019-09-19 ENCOUNTER — Ambulatory Visit: Payer: Commercial Managed Care - PPO | Admitting: Podiatry

## 2019-09-27 ENCOUNTER — Other Ambulatory Visit: Payer: Self-pay | Admitting: Orthopedic Surgery

## 2019-09-27 DIAGNOSIS — M259 Joint disorder, unspecified: Secondary | ICD-10-CM

## 2019-10-03 ENCOUNTER — Ambulatory Visit: Payer: Commercial Managed Care - PPO | Admitting: Podiatry

## 2019-10-03 ENCOUNTER — Other Ambulatory Visit: Payer: Self-pay

## 2019-10-06 ENCOUNTER — Ambulatory Visit (INDEPENDENT_AMBULATORY_CARE_PROVIDER_SITE_OTHER): Payer: Commercial Managed Care - PPO | Admitting: Podiatry

## 2019-10-06 ENCOUNTER — Encounter: Payer: Self-pay | Admitting: Podiatry

## 2019-10-06 ENCOUNTER — Other Ambulatory Visit: Payer: Self-pay

## 2019-10-06 DIAGNOSIS — E0843 Diabetes mellitus due to underlying condition with diabetic autonomic (poly)neuropathy: Secondary | ICD-10-CM | POA: Diagnosis not present

## 2019-10-06 DIAGNOSIS — M79676 Pain in unspecified toe(s): Secondary | ICD-10-CM | POA: Diagnosis not present

## 2019-10-06 DIAGNOSIS — B351 Tinea unguium: Secondary | ICD-10-CM

## 2019-10-06 NOTE — Progress Notes (Signed)
This patient returns to my office for at risk foot care.  This patient requires this care by a professional since this patient will be at risk due to having type 2 diabetes and CKD.  This patient is unable to cut nails himself since the patient cannot reach his nails.These nails are painful walking and wearing shoes.  This patient presents for at risk foot care today.  General Appearance  Alert, conversant and in no acute stress.  Vascular  Dorsalis pedis and posterior tibial  pulses are palpable  bilaterally.  Capillary return is within normal limits  bilaterally. Temperature is within normal limits  bilaterally.  Neurologic  Senn-Weinstein monofilament wire test diminished  bilaterally. Muscle power within normal limits bilaterally.  Nails Thick disfigured discolored nails with subungual debris  from hallux to fifth toes bilaterally. No evidence of bacterial infection or drainage bilaterally.  Orthopedic  No limitations of motion  feet .  No crepitus or effusions noted.  No bony pathology or digital deformities noted.  Skin  normotropic skin with no porokeratosis noted bilaterally.  No signs of infections or ulcers noted.  Peeling skin noted.   Onychomycosis  Pain in right toes  Pain in left toes  Consent was obtained for treatment procedures.   Mechanical debridement of nails 1-5  bilaterally performed with a nail nipper.  Filed with dremel without incident.    Return office visit  3 months                   Told patient to return for periodic foot care and evaluation due to potential at risk complications.   Gardiner Barefoot DPM

## 2019-10-10 ENCOUNTER — Ambulatory Visit
Admission: RE | Admit: 2019-10-10 | Discharge: 2019-10-10 | Disposition: A | Discharge status: Home / Self Care | Payer: Commercial Managed Care - PPO | Source: Ambulatory Visit | Attending: Orthopedic Surgery | Admitting: Orthopedic Surgery

## 2019-10-10 ENCOUNTER — Other Ambulatory Visit: Payer: Self-pay

## 2019-10-10 DIAGNOSIS — M259 Joint disorder, unspecified: Secondary | ICD-10-CM

## 2019-11-01 ENCOUNTER — Other Ambulatory Visit: Payer: Self-pay | Admitting: Orthopedic Surgery

## 2019-11-01 DIAGNOSIS — M259 Joint disorder, unspecified: Secondary | ICD-10-CM

## 2019-11-15 ENCOUNTER — Other Ambulatory Visit: Payer: Commercial Managed Care - PPO

## 2020-01-05 ENCOUNTER — Encounter: Payer: Self-pay | Admitting: Podiatry

## 2020-01-05 ENCOUNTER — Ambulatory Visit: Payer: Commercial Managed Care - PPO | Admitting: Podiatry

## 2020-01-05 ENCOUNTER — Other Ambulatory Visit: Payer: Self-pay

## 2020-01-05 DIAGNOSIS — E0843 Diabetes mellitus due to underlying condition with diabetic autonomic (poly)neuropathy: Secondary | ICD-10-CM | POA: Diagnosis not present

## 2020-01-05 DIAGNOSIS — M79676 Pain in unspecified toe(s): Secondary | ICD-10-CM

## 2020-01-05 DIAGNOSIS — L84 Corns and callosities: Secondary | ICD-10-CM

## 2020-01-05 DIAGNOSIS — B351 Tinea unguium: Secondary | ICD-10-CM | POA: Diagnosis not present

## 2020-01-05 NOTE — Progress Notes (Addendum)
This patient returns to my office for at risk foot care.  This patient requires this care by a professional since this patient will be at risk due to having type 2 diabetes and CKD.  This patient is unable to cut nails himself since the patient cannot reach his nails.These nails are painful walking and wearing shoes.  This patient presents for at risk foot care today.  General Appearance  Alert, conversant and in no acute stress.  Vascular  Dorsalis pedis and posterior tibial  pulses are palpable  bilaterally.  Capillary return is within normal limits  bilaterally. Temperature is within normal limits  bilaterally.  Neurologic  Senn-Weinstein monofilament wire test diminished  bilaterally. Muscle power within normal limits bilaterally.  Nails Thick disfigured discolored nails with subungual debris  from hallux to fifth toes bilaterally. No evidence of bacterial infection or drainage bilaterally.  Orthopedic  No limitations of motion  feet .  No crepitus or effusions noted.  No bony pathology or digital deformities noted. Hallux limitus 1st MPJ  B/L.  Skin  normotropic skin  noted bilaterally.  No signs of infections or ulcers noted.  Peeling skin noted.  Pinch callus  B/L. Porokeratosis sub 3 left foot.  Onychomycosis  Pain in right toes  Pain in left toes  Callus  B/L  Consent was obtained for treatment procedures.   Mechanical debridement of nails 1-5  bilaterally performed with a nail nipper.  Filed with dremel without incident. Debridement of callus with # 15 blade  B/L.  Recommend Lamisil-AF.   Return office visit  10 weeks.                  Told patient to return for periodic foot care and evaluation due to potential at risk complications.   Helane Gunther DPM

## 2020-03-15 ENCOUNTER — Ambulatory Visit: Payer: Commercial Managed Care - PPO | Admitting: Podiatry

## 2020-03-19 IMAGING — CT CT ABD-PEL WO/W CM
2 of 12 series · 9 of 46 positions shown, 15 images · IV contrast (APPLIED)
Comparison: Multiple exams, including 05/02/2017

CLINICAL DATA: Multiple urinary tract infections and hematuria.
History of prostate cancer.

EXAM:
CT ABDOMEN AND PELVIS WITHOUT AND WITH CONTRAST
TECHNIQUE: Multidetector CT imaging of the abdomen and pelvis was performed
following the standard protocol before and following the bolus
administration of intravenous contrast.
CONTRAST:  125mL OMNIPAQUE IOHEXOL 300 MG/ML  SOLN

[Series 5: coronal pre · coronal · non-contrast · 0.75mm/px · 2 of 98 slices shown, 3 images]
[im 33/98  soft-tissue]
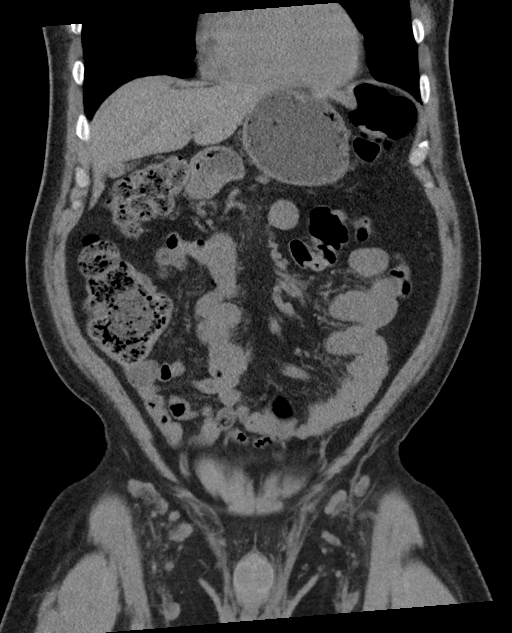
[im 33/98  bone]
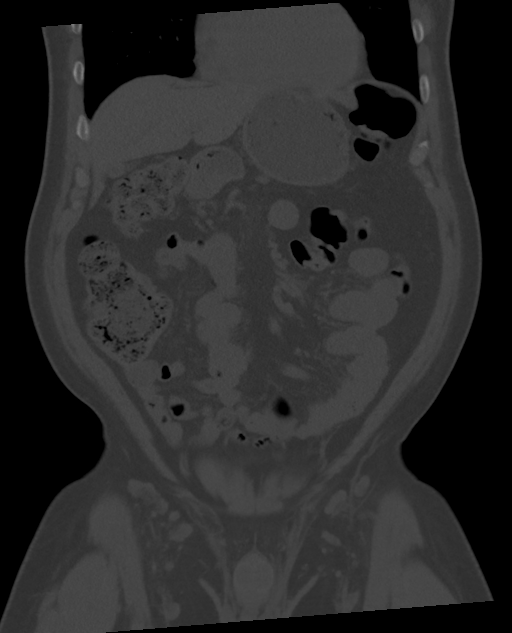
[im 65/98  soft-tissue]
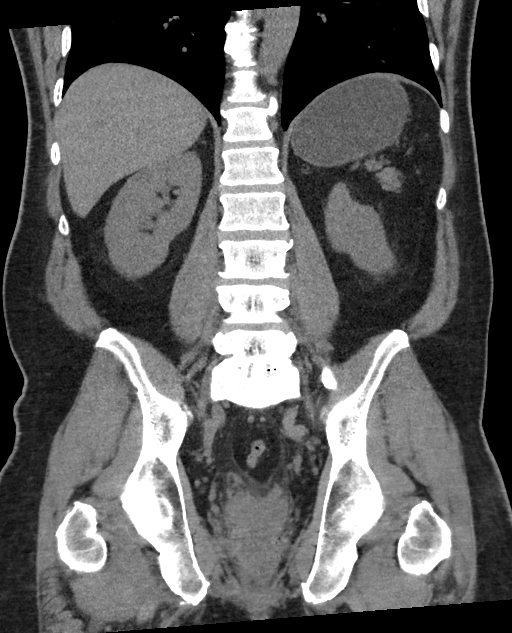

[Series 13: axial delay · axial · delayed · 0.88mm/px · z∈[-529,-169]mm · 7 of 98 slices shown, 12 images]
[im 13/98  soft-tissue]
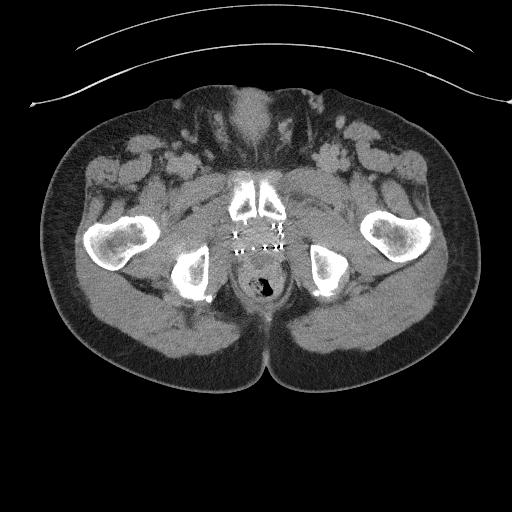
[im 13/98  bone]
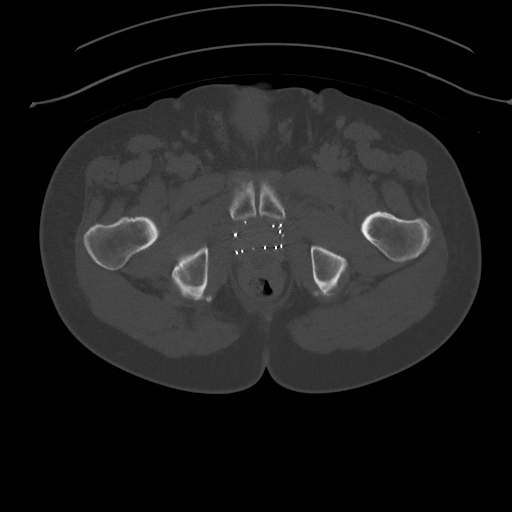
[im 25/98  soft-tissue]
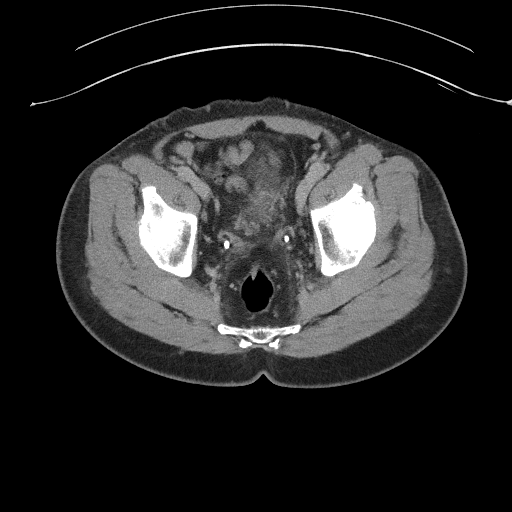
[im 37/98  soft-tissue]
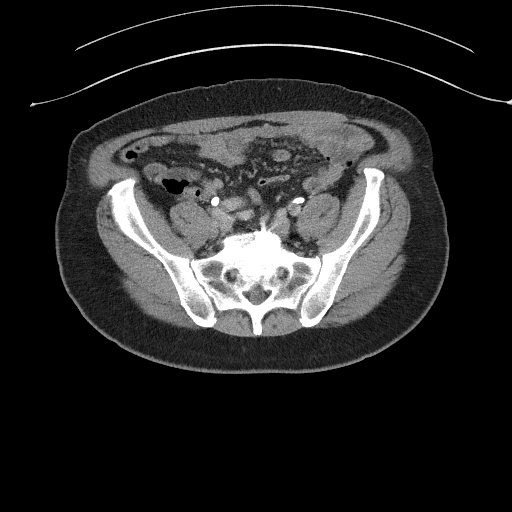
[im 49/98  soft-tissue]
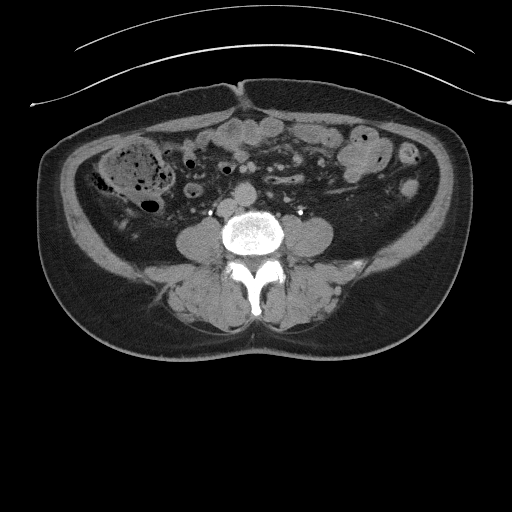
[im 49/98  lung]
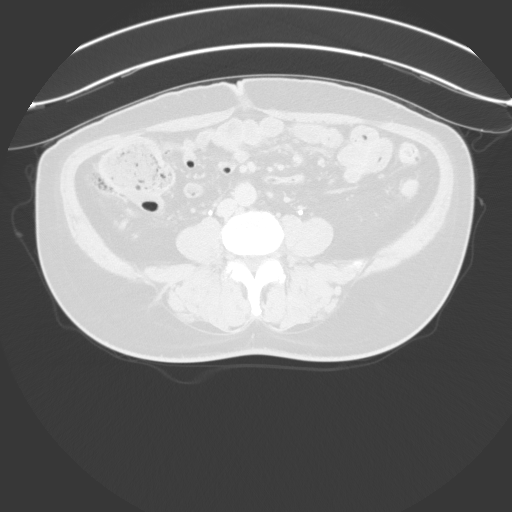
[im 61/98  soft-tissue]
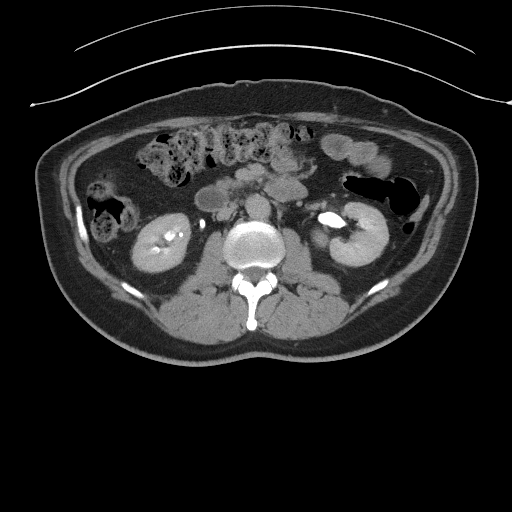
[im 61/98  lung]
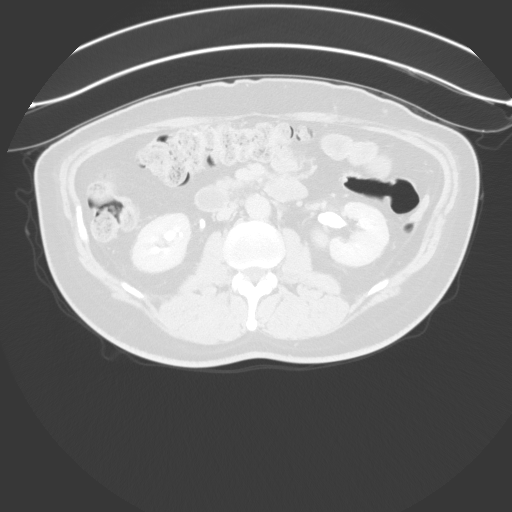
[im 73/98  soft-tissue]
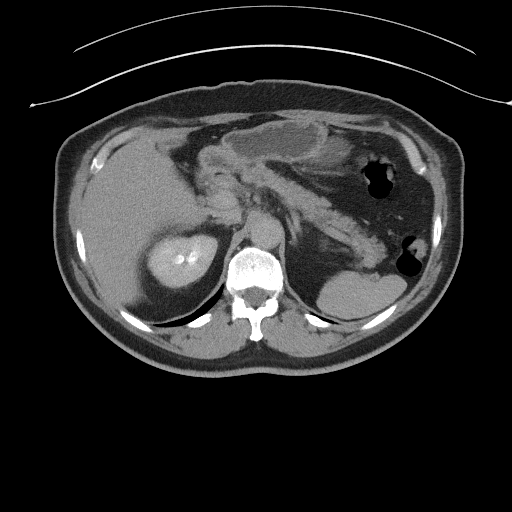
[im 73/98  lung]
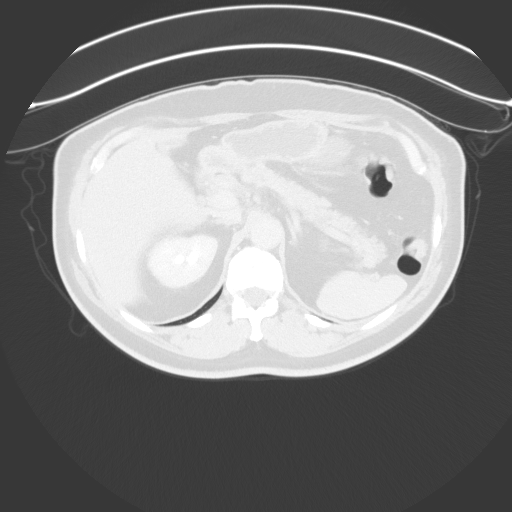
[im 85/98  soft-tissue]
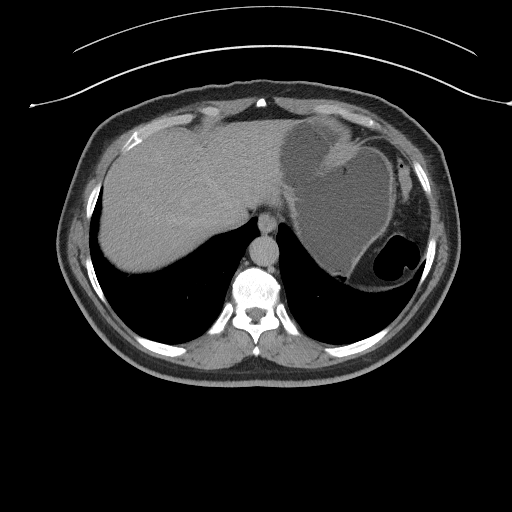
[im 85/98  lung]
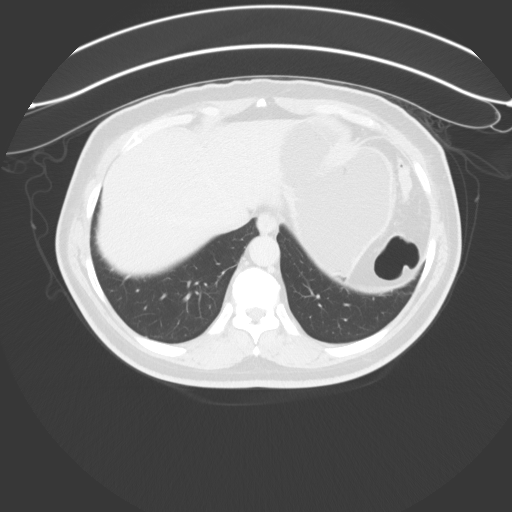

[9 of 46 positions shown; findings below may reference images not displayed]

FINDINGS: Lower chest: Stable 8 mm lymph node in the right anterior
pericardial adipose tissue.

Hepatobiliary: Contracted gallbladder.  Otherwise unremarkable.

Pancreas: Unremarkable

Spleen: Unremarkable

Adrenals/Urinary Tract: Scarring of the left kidney upper pole.
by 0.7 cm hypodense lesion of the left mid kidney is similar to the
prior exam and likely cysts although technically too small to
characterize. 2 by 3 mm hypodense lesion in the right kidney lower
pole on image 40/10, similarly probably cysts but technically too
small to characterize. Suspected parenchymal calcification in the
right kidney lower pole measuring 0.4 cm in diameter on image 44/10,
this does not appear to correspond to a calyx based on the delayed
images. No urinary tract calculi are identified.

No abnormal enhancement pattern in the renal parenchyma. Diffuse
urinary bladder wall thickening may at least partially be due to
nondistention. No obvious abnormal filling defect along the
urothelium.

Stomach/Bowel: Along the anterior margin of the rectum and posterior
margin of the prostate gland, there is a new rim enhancing lesion
with central fluid signal intensity. This lesion measures 3.0 by
by 3.4 cm (volume = 11 cm^3) as measured on image 82/7. I do not
see any gas in the fluid signal intensity portion of the lesion to
suggest direct connection to the rectum. This lesion is probably
more associated with the prostate gland than the rectum. There is
potentially mild wall thickening of the adjacent lower rectum. This
lesion was not apparent on 05/02/2017

Vascular/Lymphatic: Aortoiliac atherosclerotic vascular disease.

A left inguinal lymph node measures 1.3 cm in short axis on image
89/7, stable from 05/02/2017. Scattered additional small pelvic
lymph nodes are identified, but no overtly pathologic adenopathy is
observed.

Reproductive: Seed implants are present in the prostate gland which
has indistinct margins. As noted above, there is a rim enhancing
lesion along the posterior margin of the prostate gland and anterior
to the rectum without internal gas but with internal fluid signal.
This lesion was not readily apparent on 05/02/2017. There is
contrast medium in the urinary bladder but not within this structure
on the delayed images.

Other: No supplemental non-categorized findings.

Musculoskeletal: Bridging spurring of the left sacroiliac joint.
Postoperative findings at L5-S1. Degenerative disc disease at L4-5.
No compelling findings of osseous metastatic disease.
IMPRESSION: 1. Rim enhancing lesion with central fluid density along the
posterior margin of the prostate gland and tangential to the lower
rectum. This was not readily apparent on 05/02/2017, and is
posterior to the expected location of the prosthetic urethra.
Possibilities include centrally necrotic mass, prosthetic abscess,
or postoperative/postprocedural fluid collection along the central
zone and posteromedial peripheral zone of the prostate gland. No gas
within the lesion to suggest direct fistulous connection with the
rectum.
2. No pathologic adenopathy or evidence of osseous metastatic
disease.
3. Diffuse bladder wall thickening although some of this may be from
nondistention.
4. Brachytherapy seed implants in the prostate gland.
5.  Aortic Atherosclerosis ([id number].S).

## 2020-04-23 ENCOUNTER — Other Ambulatory Visit
Admission: RE | Admit: 2020-04-23 | Discharge: 2020-04-23 | Disposition: A | Discharge status: Home / Self Care | Payer: Commercial Managed Care - PPO | Source: Ambulatory Visit | Attending: Internal Medicine | Admitting: Internal Medicine

## 2020-04-23 DIAGNOSIS — Z01818 Encounter for other preprocedural examination: Secondary | ICD-10-CM | POA: Insufficient documentation

## 2020-04-23 DIAGNOSIS — Z79899 Other long term (current) drug therapy: Secondary | ICD-10-CM | POA: Diagnosis present

## 2020-04-23 DIAGNOSIS — I208 Other forms of angina pectoris: Secondary | ICD-10-CM | POA: Diagnosis present

## 2020-04-23 LAB — BRAIN NATRIURETIC PEPTIDE: B Natriuretic Peptide: 23 pg/mL (ref 0.0–100.0)

## 2020-05-07 ENCOUNTER — Other Ambulatory Visit
Admission: RE | Admit: 2020-05-07 | Discharge: 2020-05-07 | Disposition: A | Discharge status: Home / Self Care | Payer: Commercial Managed Care - PPO | Source: Ambulatory Visit | Attending: Internal Medicine | Admitting: Internal Medicine

## 2020-05-07 ENCOUNTER — Other Ambulatory Visit: Payer: Self-pay

## 2020-05-07 DIAGNOSIS — Z01812 Encounter for preprocedural laboratory examination: Secondary | ICD-10-CM | POA: Diagnosis not present

## 2020-05-07 DIAGNOSIS — Z20822 Contact with and (suspected) exposure to covid-19: Secondary | ICD-10-CM | POA: Diagnosis not present

## 2020-05-08 LAB — SARS CORONAVIRUS 2 (TAT 6-24 HRS): SARS Coronavirus 2: NEGATIVE

## 2020-05-09 ENCOUNTER — Ambulatory Visit
Admission: RE | Admit: 2020-05-09 | Discharge: 2020-05-09 | Disposition: A | Discharge status: Home / Self Care | Payer: Commercial Managed Care - PPO | Attending: Internal Medicine | Admitting: Internal Medicine

## 2020-05-09 ENCOUNTER — Encounter: Payer: Self-pay | Admitting: Internal Medicine

## 2020-05-09 ENCOUNTER — Encounter
Admission: RE | Disposition: A | Discharge status: Home / Self Care | Payer: Self-pay | Source: Home / Self Care | Attending: Internal Medicine

## 2020-05-09 DIAGNOSIS — R9439 Abnormal result of other cardiovascular function study: Secondary | ICD-10-CM | POA: Insufficient documentation

## 2020-05-09 HISTORY — PX: LEFT HEART CATH AND CORONARY ANGIOGRAPHY: CATH118249

## 2020-05-09 LAB — GLUCOSE, CAPILLARY
Glucose-Capillary: 115 mg/dL — ABNORMAL HIGH (ref 70–99)
Glucose-Capillary: 64 mg/dL — ABNORMAL LOW (ref 70–99)
Glucose-Capillary: 76 mg/dL (ref 70–99)

## 2020-05-09 SURGERY — LEFT HEART CATH AND CORONARY ANGIOGRAPHY
Anesthesia: Moderate Sedation | Laterality: Left

## 2020-05-09 MED ORDER — LABETALOL HCL 5 MG/ML IV SOLN
INTRAVENOUS | Status: AC
  Filled 2020-05-09: qty 4

## 2020-05-09 MED ORDER — HEPARIN (PORCINE) IN NACL 1000-0.9 UT/500ML-% IV SOLN
INTRAVENOUS | Status: AC
  Filled 2020-05-09: qty 1000

## 2020-05-09 MED ORDER — MIDAZOLAM HCL 2 MG/2ML IJ SOLN
INTRAMUSCULAR | Status: DC | PRN
  Administered 2020-05-09: 1 mg via INTRAVENOUS

## 2020-05-09 MED ORDER — SODIUM CHLORIDE 0.9% FLUSH: 3.0000 mL | INTRAVENOUS | Status: DC | PRN

## 2020-05-09 MED ORDER — ASPIRIN 81 MG PO CHEW
81.0000 mg | CHEWABLE_TABLET | ORAL | Status: AC
  Administered 2020-05-09: 81 mg via ORAL

## 2020-05-09 MED ORDER — HEPARIN (PORCINE) IN NACL 2000-0.9 UNIT/L-% IV SOLN
INTRAVENOUS | Status: DC | PRN
  Administered 2020-05-09: 1000 mL

## 2020-05-09 MED ORDER — IOHEXOL 300 MG/ML  SOLN
INTRAMUSCULAR | Status: DC | PRN
  Administered 2020-05-09: 90 mL

## 2020-05-09 MED ORDER — FENTANYL CITRATE (PF) 100 MCG/2ML IJ SOLN
INTRAMUSCULAR | Status: AC
  Filled 2020-05-09: qty 2

## 2020-05-09 MED ORDER — SODIUM CHLORIDE 0.9 % WEIGHT BASED INFUSION
3.0000 mL/kg/h | INTRAVENOUS | Status: AC
  Administered 2020-05-09: 3 mL/kg/h via INTRAVENOUS

## 2020-05-09 MED ORDER — ASPIRIN 81 MG PO CHEW
CHEWABLE_TABLET | ORAL | Status: AC
  Filled 2020-05-09: qty 1

## 2020-05-09 MED ORDER — SODIUM CHLORIDE 0.9 % WEIGHT BASED INFUSION: 1.0000 mL/kg/h | INTRAVENOUS | Status: DC

## 2020-05-09 MED ORDER — ONDANSETRON HCL 4 MG/2ML IJ SOLN: 4.0000 mg | Freq: Four times a day (QID) | INTRAMUSCULAR | Status: DC | PRN

## 2020-05-09 MED ORDER — FENTANYL CITRATE (PF) 100 MCG/2ML IJ SOLN
INTRAMUSCULAR | Status: DC | PRN
  Administered 2020-05-09: 50 ug via INTRAVENOUS

## 2020-05-09 MED ORDER — ACETAMINOPHEN 325 MG PO TABS: 650.0000 mg | ORAL_TABLET | ORAL | Status: DC | PRN

## 2020-05-09 MED ORDER — LABETALOL HCL 5 MG/ML IV SOLN
10.0000 mg | INTRAVENOUS | Status: DC | PRN
  Administered 2020-05-09 (×2): 10 mg via INTRAVENOUS

## 2020-05-09 MED ORDER — HYDRALAZINE HCL 20 MG/ML IJ SOLN: 10.0000 mg | INTRAMUSCULAR | Status: DC | PRN

## 2020-05-09 MED ORDER — SODIUM CHLORIDE 0.9 % IV SOLN: 250.0000 mL | INTRAVENOUS | Status: DC | PRN

## 2020-05-09 MED ORDER — LIDOCAINE HCL (PF) 1 % IJ SOLN
INTRAMUSCULAR | Status: AC
  Filled 2020-05-09: qty 30

## 2020-05-09 MED ORDER — LIDOCAINE HCL (PF) 1 % IJ SOLN
INTRAMUSCULAR | Status: DC | PRN
  Administered 2020-05-09: 10 mL

## 2020-05-09 MED ORDER — SODIUM CHLORIDE 0.9 % WEIGHT BASED INFUSION
1.0000 mL/kg/h | INTRAVENOUS | Status: DC
  Administered 2020-05-09: 1 mL/kg/h via INTRAVENOUS

## 2020-05-09 MED ORDER — SODIUM CHLORIDE 0.9% FLUSH: 3.0000 mL | Freq: Two times a day (BID) | INTRAVENOUS | Status: DC

## 2020-05-09 MED ORDER — LABETALOL HCL 5 MG/ML IV SOLN
INTRAVENOUS | Status: AC
  Administered 2020-05-09: 10 mg via INTRAVENOUS
  Filled 2020-05-09: qty 4

## 2020-05-09 MED ORDER — MIDAZOLAM HCL 2 MG/2ML IJ SOLN
INTRAMUSCULAR | Status: AC
  Filled 2020-05-09: qty 2

## 2020-05-09 SURGICAL SUPPLY — 12 items
CATH INFINITI 5FR JL4 (CATHETERS) ×2 IMPLANT
CATH INFINITI 5FR JL5 (CATHETERS) ×2 IMPLANT
CATH INFINITI JR4 5F (CATHETERS) ×2 IMPLANT
DEVICE CLOSURE MYNXGRIP 5F (Vascular Products) ×2 IMPLANT
NEEDLE PERC 18GX7CM (NEEDLE) ×2 IMPLANT
PACK CARDIAC CATH (CUSTOM PROCEDURE TRAY) ×2 IMPLANT
PROTECTION STATION PRESSURIZED (MISCELLANEOUS) ×2
SET ATX SIMPLICITY (MISCELLANEOUS) ×2 IMPLANT
SHEATH AVANTI 5FR X 11CM (SHEATH) ×2 IMPLANT
STATION PROTECTION PRESSURIZED (MISCELLANEOUS) ×1 IMPLANT
WIRE GUIDERIGHT .035X150 (WIRE) ×2 IMPLANT
WIRE HITORQ VERSACORE ST 145CM (WIRE) ×2 IMPLANT

## 2020-05-09 NOTE — Discharge Instructions (Signed)
Femoral Site Care  This sheet gives you information about how to care for yourself after your procedure. Your health care provider may also give you more specific instructions. If you have problems or questions, contact your health care provider. What can I expect after the procedure? After the procedure, it is common to have:  Bruising that usually fades within 1-2 weeks.  Tenderness at the site. Follow these instructions at home: Wound care  Follow instructions from your health care provider about how to take care of your insertion site. Make sure you: ? Wash your hands with soap and water before you change your bandage (dressing). If soap and water are not available, use hand sanitizer. ? Change your dressing as told by your health care provider. ? Leave stitches (sutures), skin glue, or adhesive strips in place. These skin closures may need to stay in place for 2 weeks or longer. If adhesive strip edges start to loosen and curl up, you may trim the loose edges. Do not remove adhesive strips completely unless your health care provider tells you to do that.  Do not take baths, swim, or use a hot tub until your health care provider approves.  You may shower 24-48 hours after the procedure or as told by your health care provider. ? Gently wash the site with plain soap and water. ? Pat the area dry with a clean towel. ? Do not rub the site. This may cause bleeding.  Do not apply powder or lotion to the site. Keep the site clean and dry.  Check your femoral site every day for signs of infection. Check for: ? Redness, swelling, or pain. ? Fluid or blood. ? Warmth. ? Pus or a bad smell. Activity  For the first 2-3 days after your procedure, or as long as directed: ? Avoid climbing stairs as much as possible. ? Do not squat.  Do not lift anything that is heavier than 10 lb (4.5 kg), or the limit that you are told, until your health care provider says that it is safe.  Rest as  directed. ? Avoid sitting for a long time without moving. Get up to take short walks every 1-2 hours.  Do not drive for 24 hours if you were given a medicine to help you relax (sedative). General instructions  Take over-the-counter and prescription medicines only as told by your health care provider.  Keep all follow-up visits as told by your health care provider. This is important. Contact a health care provider if you have:  A fever or chills.  You have redness, swelling, or pain around your insertion site. Get help right away if:  The catheter insertion area swells very fast.  You pass out.  You suddenly start to sweat or your skin gets clammy.  The catheter insertion area is bleeding, and the bleeding does not stop when you hold steady pressure on the area.  The area near or just beyond the catheter insertion site becomes pale, cool, tingly, or numb. These symptoms may represent a serious problem that is an emergency. Do not wait to see if the symptoms will go away. Get medical help right away. Call your local emergency services (911 in the [place] Do not drive yourself to the hospital. Summary  After the procedure, it is common to have bruising that usually fades within 1-2 weeks.  Check your femoral site every day for signs of infection.  Do not lift anything that is heavier than 10 lb (4.5 kg), or   the limit that you are told, until your health care provider says that it is safe. This information is not intended to replace advice given to you by your health care provider. Make sure you discuss any questions you have with your health care provider. Document Revised: 08/26/2019 Document Reviewed: 08/26/2019 Elsevier Patient Education  2021 Elsevier Inc.  

## 2020-05-09 NOTE — OR Nursing (Addendum)
PT given cup of orange juice for hypoglycemia per hypoglycemic protocol. Will reassess CBG

## 2020-05-10 ENCOUNTER — Encounter: Payer: Self-pay | Admitting: Internal Medicine

## 2020-09-05 ENCOUNTER — Encounter: Admission: RE | Payer: Self-pay | Source: Home / Self Care

## 2020-09-05 ENCOUNTER — Ambulatory Visit: Admission: RE | Admit: 2020-09-05 | Payer: Commercial Managed Care - PPO | Source: Home / Self Care

## 2020-09-05 SURGERY — COLONOSCOPY WITH PROPOFOL
Anesthesia: General

## 2021-02-27 ENCOUNTER — Ambulatory Visit (INDEPENDENT_AMBULATORY_CARE_PROVIDER_SITE_OTHER): Payer: Commercial Managed Care - PPO

## 2021-02-27 ENCOUNTER — Encounter: Payer: Self-pay | Admitting: Podiatry

## 2021-02-27 ENCOUNTER — Other Ambulatory Visit: Payer: Self-pay

## 2021-02-27 ENCOUNTER — Ambulatory Visit: Payer: Commercial Managed Care - PPO | Admitting: Podiatry

## 2021-02-27 DIAGNOSIS — M79676 Pain in unspecified toe(s): Secondary | ICD-10-CM

## 2021-02-27 DIAGNOSIS — M7752 Other enthesopathy of left foot: Secondary | ICD-10-CM

## 2021-02-27 DIAGNOSIS — M779 Enthesopathy, unspecified: Secondary | ICD-10-CM | POA: Diagnosis not present

## 2021-02-27 DIAGNOSIS — M7751 Other enthesopathy of right foot: Secondary | ICD-10-CM

## 2021-02-27 DIAGNOSIS — B351 Tinea unguium: Secondary | ICD-10-CM | POA: Diagnosis not present

## 2021-02-27 MED ORDER — TRIAMCINOLONE ACETONIDE 10 MG/ML IJ SUSP
20.0000 mg | Freq: Once | INTRAMUSCULAR | Status: AC
  Administered 2021-02-27: 20 mg

## 2021-02-27 NOTE — Progress Notes (Signed)
Subjective:   Patient ID: Nicholas Aran., male   DOB: 66 y.o.   MRN: [id number]   HPI Patient presents has developed a lot of forefoot pain of both feet and also has some tingling and numbness in his feet and does have diabetes that he is keeping under good control and has severe nail disease 1-5 both feet that he cannot take care of   Review of Systems  All other systems reviewed and are negative.      Objective:  Physical Exam  Neurovascular status intact muscle strength found to be adequate range of motion adequate with inflammation pain around the second metatarsal phalangeal joint mildly into the third metatarsal phalangeal joint bilateral.  Patient is noted to have thickened dystrophic toenails 1-5 both feet painful when pressed     Assessment:  Hopeful for chronic capsulitis beyond the neuropathic pain that he experiences that is treated with medication and also mycotic nail infection with pain 1-5 both feet     Plan:  H&P conditions reviewed sterile prep and injected periarticular around the second and third MPJs bilateral 3 mg dexamethasone Kenalog 5 mg Xylocaine debrided nailbeds 1-5 both feet no iatrogenic bleeding reappoint routine care  X-rays indicate that he does have previous osteotomies first metatarsal bilateral bit of done well fixation in place with mild elongation second metatarsal segment bilateral

## 2021-05-13 DIAGNOSIS — E7800 Pure hypercholesterolemia, unspecified: Secondary | ICD-10-CM | POA: Insufficient documentation

## 2021-10-17 ENCOUNTER — Ambulatory Visit
Admission: EM | Admit: 2021-10-17 | Discharge: 2021-10-17 | Disposition: A | Discharge status: Home / Self Care | Payer: Commercial Managed Care - PPO | Attending: Emergency Medicine | Admitting: Emergency Medicine

## 2021-10-17 DIAGNOSIS — N189 Chronic kidney disease, unspecified: Secondary | ICD-10-CM | POA: Diagnosis not present

## 2021-10-17 DIAGNOSIS — M3214 Glomerular disease in systemic lupus erythematosus: Secondary | ICD-10-CM | POA: Insufficient documentation

## 2021-10-17 DIAGNOSIS — Z7984 Long term (current) use of oral hypoglycemic drugs: Secondary | ICD-10-CM | POA: Insufficient documentation

## 2021-10-17 DIAGNOSIS — E1122 Type 2 diabetes mellitus with diabetic chronic kidney disease: Secondary | ICD-10-CM | POA: Diagnosis not present

## 2021-10-17 DIAGNOSIS — J069 Acute upper respiratory infection, unspecified: Secondary | ICD-10-CM

## 2021-10-17 DIAGNOSIS — U071 COVID-19: Secondary | ICD-10-CM | POA: Diagnosis present

## 2021-10-17 DIAGNOSIS — I129 Hypertensive chronic kidney disease with stage 1 through stage 4 chronic kidney disease, or unspecified chronic kidney disease: Secondary | ICD-10-CM | POA: Insufficient documentation

## 2021-10-17 DIAGNOSIS — R0981 Nasal congestion: Secondary | ICD-10-CM | POA: Insufficient documentation

## 2021-10-17 MED ORDER — BENZONATATE 100 MG PO CAPS: 100.0000 mg | ORAL_CAPSULE | Freq: Three times a day (TID) | ORAL | 0 refills | Status: DC | PRN

## 2021-10-17 NOTE — ED Triage Notes (Signed)
Patient to Urgent Care with complaints of cough/ runny nose/ congestion/ chills. Reports recently returning from a cruise. Symptoms started 10/5.

## 2021-10-17 NOTE — Discharge Instructions (Addendum)
Your COVID test is pending.    Take the [facility] as directed for cough.  Take Tylenol or ibuprofen as needed for fever or discomfort.  Rest and keep yourself hydrated.    Follow-up with your primary care provider if your symptoms are not improving.

## 2021-10-17 NOTE — ED Provider Notes (Signed)
[facility]    CSN: [id number] Arrival date & time: 10/17/21  1046      History   Chief Complaint Chief Complaint  Patient presents with   Nasal Congestion    HPI Nicholas Vazquez. is a 66 y.o. male.  Patient presents with 2-day history of congestion, runny nose, cough.  No fever, rash, sore throat, shortness of breath, vomiting, diarrhea, or other symptoms.  Treatment at home with NyQuil taken last night.  He recently returned from a vacation cruise.  His medical history includes diabetes, CKD, prostate cancer, hypertension, lupus.  The history is provided by the patient and medical records.    Past Medical History:  Diagnosis Date   Abnormal ultrasound of prostate November 07, 2014   Acute back pain with sciatica    Midline Low back- Right-side   Arthritis    Lumbosacral DDD and  Rheumatoid   Cancer ([place]) 2017   PROSTATE   Chronic kidney disease    H/O KIDNEY STONES   Collagen vascular disease ([place])    Lupus   Diabetes mellitus without complication ([place])    Type II; pt stated it was due to steroid injections he was given-GLUCOSE NORMALIZED AFTER THAT   Enlarged prostate    GERD (gastroesophageal reflux disease)    Glaucoma    Bilateral   History of kidney stones    Hypertension    Lupus ([place])     Patient Active Problem List   Diagnosis Date Noted   Nasal congestion 10/17/2021   Callus 01/05/2020   Anal skin tag 11/18/2018   Grade I hemorrhoids 11/18/2018   Headache disorder 03/30/2018   Carcinoma of prostate ([place]) 03/23/2018   Hypertensive disorder 03/23/2018   OA (osteoarthritis) 01/13/2017   Cancer of prostate ([place]) 03/27/2015   Health care maintenance 03/27/2015   Back pain with sciatica 11/09/2014   Glaucoma 04/11/2013   Iritis 04/11/2013   Kidney stones 04/11/2013   Lipoma of scalp 04/11/2013   Lipoma of shoulder 04/11/2013   Lupus ([place]) 04/11/2013   Type 2 diabetes mellitus with chronic kidney disease ([place]) 04/11/2013    Past  Surgical History:  Procedure Laterality Date   ABDOMINAL EXPOSURE N/A 11/09/2014   Procedure: ABDOMINAL EXPOSURE;  Surgeon: Angelia Mould, MD;  Location: [place];  Service: Vascular;  Laterality: N/A;   ANTERIOR LUMBAR FUSION N/A 11/09/2014   Procedure: ANTERIOR LUMBAR FUSION 1 LEVEL L5 - S1;  Surgeon: Melina Schools, MD;  Location: [place];  Service: Orthopedics;  Laterality: N/A;   COLONOSCOPY W/ POLYPECTOMY     EXTRACORPOREAL SHOCK WAVE LITHOTRIPSY Right 11/20/2016   Procedure: EXTRACORPOREAL SHOCK WAVE LITHOTRIPSY (ESWL);  Surgeon: Royston Cowper, MD;  Location: [facility];  Service: Urology;  Laterality: Right;   EXTRACORPOREAL SHOCK WAVE LITHOTRIPSY Right 12/04/2016   Procedure: EXTRACORPOREAL SHOCK WAVE LITHOTRIPSY (ESWL);  Surgeon: Royston Cowper, MD;  Location: [facility];  Service: Urology;  Laterality: Right;   FOOT SURGERY Bilateral    GREEN LIGHT LASER TURP (TRANSURETHRAL RESECTION OF PROSTATE N/A 01/30/2015   Procedure: GREEN LIGHT LASER TURP (TRANSURETHRAL RESECTION OF PROSTATE;  Surgeon: Royston Cowper, MD;  Location: [facility];  Service: Urology;  Laterality: N/A;   LEFT HEART CATH AND CORONARY ANGIOGRAPHY Left 05/09/2020   Procedure: LEFT HEART CATH AND CORONARY ANGIOGRAPHY;  Surgeon: Yolonda Kida, MD;  Location: [facility];  Service: Cardiovascular;  Laterality: Left;   PROSTATE BIOPSY  October 2016   TRANSURETHRAL RESECTION OF BLADDER TUMOR  N/A 02/24/2017   Procedure: TRANSURETHRAL RESECTION OF BLADDER TUMOR (TURBT);  Surgeon: Royston Cowper, MD;  Location: [facility];  Service: Urology;  Laterality: N/A;   TUMOR REMOVAL         Home Medications    Prior to Admission medications   Medication Sig Start Date End Date Taking? Authorizing Provider  rosuvastatin (CRESTOR) 20 MG tablet Take 1 tablet by mouth daily. 05/15/20  Yes [provider]  benzonatate (TESSALON) 100 MG capsule Take 1 capsule (100 mg total) by mouth 3 (three) times daily as needed  for cough. 10/17/21  Yes Sharion Balloon, NP  brimonidine (ALPHAGAN) 0.2 % ophthalmic solution Place 1 drop into the left eye 2 (two) times daily.     [provider]  Dorzolamide HCl-Timolol Mal PF 22.3-6.8 MG/ML SOLN Place 1 drop into both eyes 2 (two) times daily.     [provider]  hydroxychloroquine (PLAQUENIL) 200 MG tablet Take 200 mg by mouth 2 (two) times daily. Sulfate 09/30/14   [provider]  lansoprazole (PREVACID) 30 MG capsule Take 30 mg by mouth daily.    [provider]  latanoprost (XALATAN) 0.005 % ophthalmic solution Place 1 drop into both eyes at bedtime.     [provider]  metFORMIN (GLUCOPHAGE-XR) 500 MG 24 hr tablet SMARTSIG:2 Tablet(s) By Mouth Every Evening 09/03/21   [provider]  OVER THE COUNTER MEDICATION Take 1 capsule by mouth in the morning and at bedtime. Bladder 2.2    [provider]  OVER THE COUNTER MEDICATION Take 1 capsule by mouth daily. Theracran    [provider]  tamsulosin (FLOMAX) 0.4 MG CAPS capsule Take 0.4 mg by mouth daily. 10/02/18   [provider]  tolterodine (DETROL LA) 4 MG 24 hr capsule Take 4 mg by mouth daily. 09/20/18   [provider]    Family History Family History  Problem Relation Age of Onset   Diabetes Mother    Varicose Veins Mother    Cancer Father     Social History Social History   Tobacco Use   Smoking status: Former    Packs/day: 0.25    Types: Cigarettes    Quit date: 01/21/1994    Years since quitting: 27.7   Smokeless tobacco: Never   Tobacco comments:    SOCIAL SMOKER ONLY  Vaping Use   Vaping Use: Never used  Substance Use Topics   Alcohol use: No    Alcohol/week: 0.0 standard drinks of alcohol   Drug use: No     Allergies   Ciprofloxacin, Levofloxacin, and Sulfa antibiotics   Review of Systems Review of Systems  Constitutional:  Positive for chills. Negative for fever.  HENT:  Positive for  congestion and rhinorrhea. Negative for ear pain and sore throat.   Respiratory:  Positive for cough. Negative for shortness of breath.   Cardiovascular:  Negative for chest pain and palpitations.  Gastrointestinal:  Negative for diarrhea and vomiting.  Skin:  Negative for color change and rash.  All other systems reviewed and are negative.    Physical Exam Triage Vital Signs ED Triage Vitals  Enc Vitals Group     BP 10/17/21 1110 134/76     Pulse Rate 10/17/21 1110 94     Resp 10/17/21 1110 18     Temp 10/17/21 1110 98.1 F (36.7 C)     Temp src --      SpO2 10/17/21 1110 95 %  Weight 10/17/21 1118 215 lb (97.5 kg)     Height 10/17/21 1117 6' (1.829 m)     Head Circumference --      Peak Flow --      Pain Score 10/17/21 1117 5     Pain Loc --      Pain Edu? --      Excl. in [place]? --    No data found.  Updated Vital Signs BP 134/76   Pulse 94   Temp 98.1 F (36.7 C)   Resp 18   Ht 6' (1.829 m)   Wt 215 lb (97.5 kg)   SpO2 95%   BMI 29.16 kg/m   Visual Acuity Right Eye Distance:   Left Eye Distance:   Bilateral Distance:    Right Eye Near:   Left Eye Near:    Bilateral Near:     Physical Exam Vitals and nursing note reviewed.  Constitutional:      General: He is not in acute distress.    Appearance: Normal appearance. He is well-developed. He is not ill-appearing.  HENT:     Right Ear: Tympanic membrane normal.     Left Ear: Tympanic membrane normal.     Nose: Nose normal.     Mouth/Throat:     Mouth: Mucous membranes are moist.     Pharynx: Oropharynx is clear.  Cardiovascular:     Rate and Rhythm: Normal rate and regular rhythm.     Heart sounds: Normal heart sounds.  Pulmonary:     Effort: Pulmonary effort is normal. No respiratory distress.     Breath sounds: Normal breath sounds.  Musculoskeletal:     Cervical back: Neck supple.  Skin:    General: Skin is warm and dry.  Neurological:     Mental Status: He is alert.  Psychiatric:         Mood and Affect: Mood normal.        Behavior: Behavior normal.      UC Treatments / Results  Labs (all labs ordered are listed, but only abnormal results are displayed) Labs Reviewed  SARS CORONAVIRUS 2 (TAT 6-24 HRS)    EKG   Radiology No results found.  Procedures Procedures (including critical care time)  Medications Ordered in UC Medications - No data to display  Initial Impression / Assessment and Plan / UC Course  I have reviewed the triage vital signs and the nursing notes.  Pertinent labs & imaging results that were available during my care of the patient were reviewed by me and considered in my medical decision making (see chart for details).    Viral URI, nasal congestion.  COVID pending.  Patient is reluctant to take antiviral medication if he is COVID positive but he would be a candidate for molnupiravir;  He will discuss this with his doctor if his test is positive.  Discussed symptomatic treatment including Tylenol or ibuprofen, rest, hydration.  Instructed patient to follow up with his PCP if his symptoms are not improving.  He agrees to plan of care.   Final Clinical Impressions(s) / UC Diagnoses   Final diagnoses:  Nasal congestion  Viral URI     Discharge Instructions      Your COVID test is pending.    Take the [facility] as directed for cough.  Take Tylenol or ibuprofen as needed for fever or discomfort.  Rest and keep yourself hydrated.    Follow-up with your primary care provider if your symptoms are not  improving.         ED Prescriptions     Medication Sig Dispense Auth. Provider   benzonatate (TESSALON) 100 MG capsule Take 1 capsule (100 mg total) by mouth 3 (three) times daily as needed for cough. 21 capsule Sharion Balloon, NP      PDMP not reviewed this encounter.   Sharion Balloon, NP 10/17/21 1156

## 2021-10-18 LAB — SARS CORONAVIRUS 2 (TAT 6-24 HRS): SARS Coronavirus 2: POSITIVE — AB

## 2021-11-04 ENCOUNTER — Encounter (INDEPENDENT_AMBULATORY_CARE_PROVIDER_SITE_OTHER): Payer: Self-pay

## 2022-03-19 ENCOUNTER — Encounter: Payer: Self-pay | Admitting: Urology

## 2022-03-19 ENCOUNTER — Ambulatory Visit (INDEPENDENT_AMBULATORY_CARE_PROVIDER_SITE_OTHER): Payer: Medicare Other | Admitting: Urology

## 2022-03-19 VITALS — BP 127/98 | HR 89 | Ht 72.0 in | Wt 218.0 lb

## 2022-03-19 DIAGNOSIS — C61 Malignant neoplasm of prostate: Secondary | ICD-10-CM

## 2022-03-19 DIAGNOSIS — N529 Male erectile dysfunction, unspecified: Secondary | ICD-10-CM | POA: Diagnosis not present

## 2022-03-19 DIAGNOSIS — R399 Unspecified symptoms and signs involving the genitourinary system: Secondary | ICD-10-CM

## 2022-03-19 MED ORDER — TAMSULOSIN HCL 0.4 MG PO CAPS: 0.4000 mg | ORAL_CAPSULE | Freq: Every day | ORAL | 11 refills | Status: DC

## 2022-03-19 MED ORDER — TADALAFIL 10 MG PO TABS: 10.0000 mg | ORAL_TABLET | Freq: Every day | ORAL | 6 refills | Status: AC | PRN

## 2022-03-19 MED ORDER — TOLTERODINE TARTRATE ER 4 MG PO CP24: 4.0000 mg | ORAL_CAPSULE | Freq: Every day | ORAL | 11 refills | Status: DC

## 2022-03-19 MED ORDER — TAMSULOSIN HCL 0.4 MG PO CAPS: 0.4000 mg | ORAL_CAPSULE | Freq: Every day | ORAL | 3 refills | Status: DC

## 2022-03-19 NOTE — Progress Notes (Signed)
03/19/22 9:37 AM   Nicholas Vazquez. 05/19/1955 [id number]  CC: History of prostate cancer, lower urinary tract symptoms, ED  HPI: 67 year old male transferring his care from Dr. Eliberto Ivory for the above issues.  He was reportedly diagnosed with low risk Gleason score 3+3 prostate cancer in 2017 and treated with brachytherapy.  He also apparently had a greenlight laser PVP around that time with Dr. Eliberto Ivory.  PSA has been very low since that time, most recently 0.05 in December 2023.  He has had some bothersome urinary symptoms since that time and has been well-managed on Flomax and Detrol.  When he stopped the Detrol he had return of overactive symptoms with urgency and frequency and is interested in resuming that medication.  He also had an episode of biopsy-proven radiation cystitis in 2019, and has not had any problems with gross hematuria or dysuria since that time.  He has had long-term ED and is interested in a trial of medications.   PMH: Past Medical History:  Diagnosis Date   Abnormal ultrasound of prostate November 07, 2014   Acute back pain with sciatica    Midline Low back- Right-side   Arthritis    Lumbosacral DDD and  Rheumatoid   Cancer ([place]) 2017   PROSTATE   Chronic kidney disease    H/O KIDNEY STONES   Collagen vascular disease ([place])    Lupus   Diabetes mellitus without complication ([place])    Type II; pt stated it was due to steroid injections he was given-GLUCOSE NORMALIZED AFTER THAT   Enlarged prostate    GERD (gastroesophageal reflux disease)    Glaucoma    Bilateral   History of kidney stones    Hypertension    Lupus ([place])     Surgical History: Past Surgical History:  Procedure Laterality Date   ABDOMINAL EXPOSURE N/A 11/09/2014   Procedure: ABDOMINAL EXPOSURE;  Surgeon: Angelia Mould, MD;  Location: [place];  Service: Vascular;  Laterality: N/A;   ANTERIOR LUMBAR FUSION N/A 11/09/2014   Procedure: ANTERIOR LUMBAR FUSION 1 LEVEL L5 - S1;  Surgeon:  Melina Schools, MD;  Location: [place];  Service: Orthopedics;  Laterality: N/A;   COLONOSCOPY W/ POLYPECTOMY     EXTRACORPOREAL SHOCK WAVE LITHOTRIPSY Right 11/20/2016   Procedure: EXTRACORPOREAL SHOCK WAVE LITHOTRIPSY (ESWL);  Surgeon: Royston Cowper, MD;  Location: [facility];  Service: Urology;  Laterality: Right;   EXTRACORPOREAL SHOCK WAVE LITHOTRIPSY Right 12/04/2016   Procedure: EXTRACORPOREAL SHOCK WAVE LITHOTRIPSY (ESWL);  Surgeon: Royston Cowper, MD;  Location: [facility];  Service: Urology;  Laterality: Right;   FOOT SURGERY Bilateral    GREEN LIGHT LASER TURP (TRANSURETHRAL RESECTION OF PROSTATE N/A 01/30/2015   Procedure: GREEN LIGHT LASER TURP (TRANSURETHRAL RESECTION OF PROSTATE;  Surgeon: Royston Cowper, MD;  Location: [facility];  Service: Urology;  Laterality: N/A;   LEFT HEART CATH AND CORONARY ANGIOGRAPHY Left 05/09/2020   Procedure: LEFT HEART CATH AND CORONARY ANGIOGRAPHY;  Surgeon: Yolonda Kida, MD;  Location: [place];  Service: Cardiovascular;  Laterality: Left;   PROSTATE BIOPSY  October 2016   TRANSURETHRAL RESECTION OF BLADDER TUMOR N/A 02/24/2017   Procedure: TRANSURETHRAL RESECTION OF BLADDER TUMOR (TURBT);  Surgeon: Royston Cowper, MD;  Location: [facility];  Service: Urology;  Laterality: N/A;   TUMOR REMOVAL       Family History: Family History  Problem Relation Age of Onset   Diabetes Mother    Varicose Veins Mother  Cancer Father     Social History:  reports that he quit smoking about 28 years ago. His smoking use included cigarettes. He smoked an average of .25 packs per day. He has been exposed to tobacco smoke. He has never used smokeless tobacco. He reports that he does not drink alcohol and does not use drugs.  Physical Exam: BP (!) 127/98   Pulse 89   Ht 6' (1.829 m)   Wt 218 lb (98.9 kg)   BMI 29.57 kg/m    Constitutional:  Alert and oriented, No acute distress. Cardiovascular: No clubbing, cyanosis, or edema. Respiratory:  Normal respiratory effort, no increased work of breathing. GI: Abdomen is soft, nontender, nondistended, no abdominal masses   Assessment & Plan:   67 year old male with low risk prostate cancer treated with brachytherapy, as well as a greenlight laser PVP at some point around the time of that procedure in 2017 by Dr. Eliberto Ivory.  PSA has remained very low, most recently 0.05 in December 2023.  He has bothersome urinary symptoms that are relatively well-controlled with Flomax and Detrol.  He also has problems with ED and has never tried medications for this.  He is interested in medications at this point.  We reviewed the AUA guidelines that recommend checking a testosterone for patients with ED and low sex drive.  Flomax refilled, restarted Detrol 4 mg daily Trial of Cialis 10 to 20 mg on demand for ED Testosterone today, call with results Continue yearly PSA monitoring Consider cystoscopy in the future if worsening urinary symptoms to evaluate for stricture  [name], MD 03/19/2022  [facility] [address], [place], [place] [address] [phone]

## 2022-03-20 ENCOUNTER — Telehealth: Payer: Self-pay

## 2022-03-20 DIAGNOSIS — R7989 Other specified abnormal findings of blood chemistry: Secondary | ICD-10-CM

## 2022-03-20 LAB — TESTOSTERONE: Testosterone: 218 ng/dL — ABNORMAL LOW (ref 264–916)

## 2022-03-20 NOTE — Telephone Encounter (Signed)
-----   Message from Billey Co, MD sent at 03/20/2022  7:50 AM EDT ----- Testosterone was low at 218.  Please schedule follow-up with PA with morning testosterone, LH prior and to discuss testosterone replacement options  [name], MD 03/20/2022

## 2022-03-20 NOTE — Telephone Encounter (Signed)
Called pt informed him of the information below. Pt voiced understanding. Testosterone ordered. Lab visit scheduled. Follow up visit scheduled.

## 2022-03-26 ENCOUNTER — Telehealth: Payer: Self-pay

## 2022-03-26 ENCOUNTER — Telehealth: Payer: Self-pay | Admitting: Urology

## 2022-03-26 DIAGNOSIS — N3281 Overactive bladder: Secondary | ICD-10-CM

## 2022-03-26 MED ORDER — OXYBUTYNIN CHLORIDE ER 10 MG PO TB24: 10.0000 mg | ORAL_TABLET | Freq: Every day | ORAL | 3 refills | Status: DC

## 2022-03-26 NOTE — Telephone Encounter (Signed)
Patient called to ask if there is another medication that can be prescribed in place of Tolterodine 4mg , since his insurance will not cover this. Please advise patient.

## 2022-03-26 NOTE — Telephone Encounter (Signed)
Alternative medication, oxybutynin sent in for patient per [facility].

## 2022-03-26 NOTE — Telephone Encounter (Signed)
Incoming denial of PA for Tolterodine 4mg  per pt's prescription drug plan.

## 2022-03-31 ENCOUNTER — Other Ambulatory Visit: Payer: Medicare Other

## 2022-03-31 DIAGNOSIS — R7989 Other specified abnormal findings of blood chemistry: Secondary | ICD-10-CM

## 2022-04-01 LAB — TESTOSTERONE: Testosterone: 252 ng/dL — ABNORMAL LOW (ref 264–916)

## 2022-04-04 ENCOUNTER — Other Ambulatory Visit: Payer: Medicare Other

## 2022-04-07 ENCOUNTER — Encounter: Payer: Self-pay | Admitting: Physician Assistant

## 2022-04-07 ENCOUNTER — Ambulatory Visit (INDEPENDENT_AMBULATORY_CARE_PROVIDER_SITE_OTHER): Payer: Medicare Other | Admitting: Physician Assistant

## 2022-04-07 VITALS — BP 103/66 | HR 88 | Ht 72.0 in | Wt 215.0 lb

## 2022-04-07 DIAGNOSIS — E291 Testicular hypofunction: Secondary | ICD-10-CM

## 2022-04-07 DIAGNOSIS — R7989 Other specified abnormal findings of blood chemistry: Secondary | ICD-10-CM

## 2022-04-07 NOTE — Patient Instructions (Signed)
Today we discussed starting testosterone therapy to increase your testosterone levels. It looks like your insurance would cover: -Testosterone gel, which is applied topically every day -Testosterone injections, which would be given every 1-2 weeks either at home or in clinic -Testosterone pellets, which are inserted into your buttock every 3 months  Please let us know how you would like to proceed

## 2022-04-08 LAB — FSH/LH
FSH: 7.1 m[IU]/mL (ref 1.5–12.4)
LH: 5.4 m[IU]/mL (ref 1.7–8.6)

## 2022-04-08 LAB — PROLACTIN: Prolactin: 9.4 ng/mL (ref 3.6–25.2)

## 2022-04-08 NOTE — Progress Notes (Signed)
04/07/2022 12:59 PM   Nicholas Vazquez [birth year] [id number]  CC: Chief Complaint  Patient presents with   Hypogonadism   HPI: Nicholas Vazquez. is a 67 y.o. male with PMH low risk prostate cancer s/p brachytherapy in 2017 with radiation cystitis in 2019, BPH with LUTS s/p greenlight PVP around 2017, urgency, frequency, and erectile dysfunction who presents today for discussion of testosterone therapy.   He has had 2 abnormally low a.m. testosterone values, 218 and 252.  He reports chronic fatigue and loss of libido as well as erectile dysfunction.  He does not desire to maintain fertility.  PMH: Past Medical History:  Diagnosis Date   Abnormal ultrasound of prostate November 07, 2014   Acute back pain with sciatica    Midline Low back- Right-side   Arthritis    Lumbosacral DDD and  Rheumatoid   Cancer 2017   PROSTATE   Chronic kidney disease    H/O KIDNEY STONES   Collagen vascular disease    Lupus   Diabetes mellitus without complication    Type II; pt stated it was due to steroid injections he was given-GLUCOSE NORMALIZED AFTER THAT   Enlarged prostate    GERD (gastroesophageal reflux disease)    Glaucoma    Bilateral   History of kidney stones    Hypertension    Lupus     Surgical History: Past Surgical History:  Procedure Laterality Date   ABDOMINAL EXPOSURE N/A 11/09/2014   Procedure: ABDOMINAL EXPOSURE;  Surgeon: Angelia Mould, MD;  Location: [place];  Service: Vascular;  Laterality: N/A;   ANTERIOR LUMBAR FUSION N/A 11/09/2014   Procedure: ANTERIOR LUMBAR FUSION 1 LEVEL L5 - S1;  Surgeon: Melina Schools, MD;  Location: [place];  Service: Orthopedics;  Laterality: N/A;   COLONOSCOPY W/ POLYPECTOMY     EXTRACORPOREAL SHOCK WAVE LITHOTRIPSY Right 11/20/2016   Procedure: EXTRACORPOREAL SHOCK WAVE LITHOTRIPSY (ESWL);  Surgeon: Royston Cowper, MD;  Location: [facility];  Service: Urology;  Laterality: Right;   EXTRACORPOREAL SHOCK WAVE LITHOTRIPSY  Right 12/04/2016   Procedure: EXTRACORPOREAL SHOCK WAVE LITHOTRIPSY (ESWL);  Surgeon: Royston Cowper, MD;  Location: [facility];  Service: Urology;  Laterality: Right;   FOOT SURGERY Bilateral    GREEN LIGHT LASER TURP (TRANSURETHRAL RESECTION OF PROSTATE N/A 01/30/2015   Procedure: GREEN LIGHT LASER TURP (TRANSURETHRAL RESECTION OF PROSTATE;  Surgeon: Royston Cowper, MD;  Location: [facility];  Service: Urology;  Laterality: N/A;   LEFT HEART CATH AND CORONARY ANGIOGRAPHY Left 05/09/2020   Procedure: LEFT HEART CATH AND CORONARY ANGIOGRAPHY;  Surgeon: Yolonda Kida, MD;  Location: [facility];  Service: Cardiovascular;  Laterality: Left;   PROSTATE BIOPSY  October 2016   TRANSURETHRAL RESECTION OF BLADDER TUMOR N/A 02/24/2017   Procedure: TRANSURETHRAL RESECTION OF BLADDER TUMOR (TURBT);  Surgeon: Royston Cowper, MD;  Location: [facility];  Service: Urology;  Laterality: N/A;   TUMOR REMOVAL      Home Medications:  Allergies as of 04/07/2022       Reactions   Ciprofloxacin Itching   Levofloxacin Itching   Other reaction(s): Other (See Comments) Extreme itching and his eyes felt like they had sand in them.   Sulfa Antibiotics Other (See Comments)   FATIGUE        Medication List        Accurate as of April 07, 2022 11:59 PM. If you have any questions, ask your nurse or doctor.  brimonidine 0.2 % ophthalmic solution Commonly known as: ALPHAGAN Place 1 drop into the left eye 2 (two) times daily.   dorzolamidel-timolol 22.3-6.8 MG/ML Soln ophthalmic solution Commonly known as: COSOPT Place 1 drop into both eyes 2 (two) times daily.   gabapentin 100 MG capsule Commonly known as: NEURONTIN Take 200 mg by mouth 3 (three) times daily as needed.   hydroxychloroquine 200 MG tablet Commonly known as: PLAQUENIL Take 200 mg by mouth 2 (two) times daily. Sulfate   lansoprazole 30 MG capsule Commonly known as: PREVACID Take 30 mg by mouth daily.   latanoprost  0.005 % ophthalmic solution Commonly known as: XALATAN Place 1 drop into both eyes at bedtime.   Linzess 72 MCG capsule Generic drug: linaclotide Take 72 mcg by mouth daily.   losartan 100 MG tablet Commonly known as: COZAAR Take 100 mg by mouth daily.   metFORMIN 500 MG 24 hr tablet Commonly known as: GLUCOPHAGE-XR SMARTSIG:2 Tablet(s) By Mouth Every Evening   OVER THE COUNTER MEDICATION Take 1 capsule by mouth in the morning and at bedtime. Bladder 2.2   OVER THE COUNTER MEDICATION Take 1 capsule by mouth daily. Theracran   oxybutynin 10 MG 24 hr tablet Commonly known as: DITROPAN-XL Take 1 tablet (10 mg total) by mouth daily.   rosuvastatin 20 MG tablet Commonly known as: CRESTOR Take 1 tablet by mouth daily.   tadalafil 10 MG tablet Commonly known as: Cialis Take 1-2 tablets (10-20 mg total) by mouth daily as needed for erectile dysfunction (take 1 hour prior to sexual activity).   tamsulosin 0.4 MG Caps capsule Commonly known as: FLOMAX Take 1 capsule (0.4 mg total) by mouth daily.        Allergies:  Allergies  Allergen Reactions   Ciprofloxacin Itching   Levofloxacin Itching    Other reaction(s): Other (See Comments) Extreme itching and his eyes felt like they had sand in them.   Sulfa Antibiotics Other (See Comments)    FATIGUE    Family History: Family History  Problem Relation Age of Onset   Diabetes Mother    Varicose Veins Mother    Cancer Father     Social History:   reports that he quit smoking about 28 years ago. His smoking use included cigarettes. He smoked an average of .25 packs per day. He has been exposed to tobacco smoke. He has never used smokeless tobacco. He reports that he does not drink alcohol and does not use drugs.  Physical Exam: BP 103/66   Pulse 88   Ht 6' (1.829 m)   Wt 215 lb (97.5 kg)   BMI 29.16 kg/m   Constitutional:  Alert and oriented, no acute distress, nontoxic appearing HEENT: [place], AT Cardiovascular: No  clubbing, cyanosis, or edema Respiratory: Normal respiratory effort, no increased work of breathing Skin: No rashes, bruises or suspicious lesions Neurologic: Grossly intact, no focal deficits, moving all 4 extremities Psychiatric: Normal mood and affect  Assessment & Plan:   1. Low testosterone in male He meets diagnostic criteria for hypogonadism with 2 abnormally low a.m. testosterone's.  Will obtain LH, FSH, and prolactin to complete workup today.  We discussed various treatment options for his hypogonadism including testosterone gel, intramuscular injections, Testopel, and Clomid.  He is most interested in pursuing Testopel, but will discuss this further with his wife and let us know how he wishes to proceed.  Brochures provided today. - FSH/LH - Prolactin  Return for Patient to call with desired therapy option.  Debroah Loop, PA-C  [facility] [place] [address], [place], [place] [address] [phone]

## 2022-11-18 ENCOUNTER — Other Ambulatory Visit: Payer: Self-pay | Admitting: Nephrology

## 2022-11-18 DIAGNOSIS — M3214 Glomerular disease in systemic lupus erythematosus: Secondary | ICD-10-CM

## 2022-11-18 DIAGNOSIS — R809 Proteinuria, unspecified: Secondary | ICD-10-CM

## 2022-11-18 DIAGNOSIS — R8290 Unspecified abnormal findings in urine: Secondary | ICD-10-CM

## 2022-11-25 ENCOUNTER — Ambulatory Visit
Admission: RE | Admit: 2022-11-25 | Discharge: 2022-11-25 | Disposition: A | Discharge status: Home / Self Care | Payer: Medicare Other | Source: Ambulatory Visit | Attending: Nephrology | Admitting: Nephrology

## 2022-11-25 DIAGNOSIS — R809 Proteinuria, unspecified: Secondary | ICD-10-CM | POA: Diagnosis present

## 2022-11-25 DIAGNOSIS — R8290 Unspecified abnormal findings in urine: Secondary | ICD-10-CM | POA: Insufficient documentation

## 2022-11-25 DIAGNOSIS — M3214 Glomerular disease in systemic lupus erythematosus: Secondary | ICD-10-CM | POA: Insufficient documentation

## 2023-01-06 ENCOUNTER — Ambulatory Visit: Payer: Medicare Other | Admitting: Physician Assistant

## 2023-01-09 ENCOUNTER — Other Ambulatory Visit: Payer: Self-pay

## 2023-01-09 ENCOUNTER — Ambulatory Visit: Payer: Medicare Other | Admitting: Physician Assistant

## 2023-01-09 DIAGNOSIS — N3281 Overactive bladder: Secondary | ICD-10-CM

## 2023-01-09 DIAGNOSIS — R399 Unspecified symptoms and signs involving the genitourinary system: Secondary | ICD-10-CM

## 2023-01-09 MED ORDER — OXYBUTYNIN CHLORIDE ER 10 MG PO TB24: 10.0000 mg | ORAL_TABLET | Freq: Every day | ORAL | 3 refills | Status: DC

## 2023-01-09 MED ORDER — TAMSULOSIN HCL 0.4 MG PO CAPS: 0.4000 mg | ORAL_CAPSULE | Freq: Every day | ORAL | 3 refills | Status: AC

## 2023-01-13 ENCOUNTER — Other Ambulatory Visit: Payer: Self-pay | Admitting: *Deleted

## 2023-01-13 ENCOUNTER — Ambulatory Visit
Admission: RE | Admit: 2023-01-13 | Discharge: 2023-01-13 | Disposition: A | Discharge status: Home / Self Care | Payer: Medicare HMO | Source: Ambulatory Visit | Attending: Physician Assistant | Admitting: Physician Assistant

## 2023-01-13 ENCOUNTER — Ambulatory Visit
Admission: RE | Admit: 2023-01-13 | Discharge: 2023-01-13 | Disposition: A | Discharge status: Home / Self Care | Payer: Medicare HMO | Attending: Physician Assistant | Admitting: Physician Assistant

## 2023-01-13 ENCOUNTER — Ambulatory Visit (INDEPENDENT_AMBULATORY_CARE_PROVIDER_SITE_OTHER): Payer: Medicare HMO | Admitting: Physician Assistant

## 2023-01-13 VITALS — BP 150/79 | HR 88 | Ht 70.0 in | Wt 215.0 lb

## 2023-01-13 DIAGNOSIS — N3281 Overactive bladder: Secondary | ICD-10-CM

## 2023-01-13 DIAGNOSIS — R972 Elevated prostate specific antigen [PSA]: Secondary | ICD-10-CM | POA: Diagnosis not present

## 2023-01-13 DIAGNOSIS — N200 Calculus of kidney: Secondary | ICD-10-CM | POA: Diagnosis present

## 2023-01-13 LAB — URINALYSIS, COMPLETE
Bilirubin, UA: NEGATIVE
Ketones, UA: NEGATIVE
Leukocytes,UA: NEGATIVE
Nitrite, UA: NEGATIVE
RBC, UA: NEGATIVE
Specific Gravity, UA: 1.02 (ref 1.005–1.030)
Urobilinogen, Ur: 0.2 mg/dL (ref 0.2–1.0)
pH, UA: 5.5 (ref 5.0–7.5)

## 2023-01-13 LAB — MICROSCOPIC EXAMINATION: Bacteria, UA: NONE SEEN

## 2023-01-13 MED ORDER — SOLIFENACIN SUCCINATE 5 MG PO TABS: 5.0000 mg | ORAL_TABLET | Freq: Every day | ORAL | 3 refills | Status: DC

## 2023-01-13 NOTE — Progress Notes (Signed)
 01/13/2023 2:58 PM   Miquel GORMAN Eva Teddie May 21, 1955 [id number]  CC: Chief Complaint  Patient presents with   Nephrolithiasis   HPI: Jameon Deller. is a 68 y.o. male with PMH low risk prostate cancer s/p brachytherapy in 2017 with radiation cystitis in 2019, BPH with frequency s/p greenlight PVP around 2017 on Flomax  and oxybutynin  XL 10 mg, and ED on tadalafil  who presents today for evaluation of nephrolithiasis.   Today he reports bothersome urgency, frequency, and urge incontinence.  He reports voiding approximately every 30 minutes during the daytime and has nocturia x 2-3.  Notably, he was started on Jardiance for his diabetes within the past 6 months and his symptoms may have worsened with this.  He remains on oxybutynin , but does not think it is helping anymore.  He also has intermittent left flank pain and underwent renal ultrasound on 11/25/2022, which was notable for a 5 mm right lower pole stone.  He admits to chronic back pain.  KUB today is challenging to interpret due to overlying bowel contents.  In-office UA today positive for 3+ glucose and trace protein; urine microscopy pan negative.  PMH: Past Medical History:  Diagnosis Date   Abnormal ultrasound of prostate November 07, 2014   Acute back pain with sciatica    Midline Low back- Right-side   Arthritis    Lumbosacral DDD and  Rheumatoid   Cancer (HCC) 2017   PROSTATE   Chronic kidney disease    H/O KIDNEY STONES   Collagen vascular disease (HCC)    Lupus   Diabetes mellitus without complication (HCC)    Type II; pt stated it was due to steroid injections he was given-GLUCOSE NORMALIZED AFTER THAT   Enlarged prostate    GERD (gastroesophageal reflux disease)    Glaucoma    Bilateral   History of kidney stones    Hypertension    Lupus (HCC)     Surgical History: Past Surgical History:  Procedure Laterality Date   ABDOMINAL EXPOSURE N/A 11/09/2014   Procedure: ABDOMINAL EXPOSURE;   Surgeon: Lonni GORMAN Blade, MD;  Location: [facility];  Service: Vascular;  Laterality: N/A;   ANTERIOR LUMBAR FUSION N/A 11/09/2014   Procedure: ANTERIOR LUMBAR FUSION 1 LEVEL L5 - S1;  Surgeon: Donaciano Sprang, MD;  [facility];  Service: Orthopedics;  Laterality: N/A;   COLONOSCOPY W/ POLYPECTOMY     EXTRACORPOREAL SHOCK WAVE LITHOTRIPSY Right 11/20/2016   Procedure: EXTRACORPOREAL SHOCK WAVE LITHOTRIPSY (ESWL);  Surgeon: Kassie Ozell SAUNDERS, MD;  Location: [facility];  Service: Urology;  Laterality: Right;   EXTRACORPOREAL SHOCK WAVE LITHOTRIPSY Right 12/04/2016   Procedure: EXTRACORPOREAL SHOCK WAVE LITHOTRIPSY (ESWL);  Surgeon: Kassie Ozell SAUNDERS, MD;  Location: [facility];  Service: Urology;  Laterality: Right;   FOOT SURGERY Bilateral    GREEN LIGHT LASER TURP (TRANSURETHRAL RESECTION OF PROSTATE N/A 01/30/2015   Procedure: GREEN LIGHT LASER TURP (TRANSURETHRAL RESECTION OF PROSTATE;  Surgeon: Ozell SAUNDERS Kassie, MD;  Location: [facility];  Service: Urology;  Laterality: N/A;   LEFT HEART CATH AND CORONARY ANGIOGRAPHY Left 05/09/2020   Procedure: LEFT HEART CATH AND CORONARY ANGIOGRAPHY;  Surgeon: Florencio Cara BIRCH, MD;  Location: [facility];  Service: Cardiovascular;  Laterality: Left;   PROSTATE BIOPSY  October 2016   TRANSURETHRAL RESECTION OF BLADDER TUMOR N/A 02/24/2017   Procedure: TRANSURETHRAL RESECTION OF BLADDER TUMOR (TURBT);  Surgeon: Kassie Ozell SAUNDERS, MD;  Location: [facility];  Service: Urology;  Laterality: N/A;   TUMOR  REMOVAL      Home Medications:  Allergies as of 01/13/2023       Reactions   Ciprofloxacin  Itching   Levofloxacin  Itching   Other reaction(s): Other (See Comments) Extreme itching and his eyes felt like they had sand in them.   Sulfa Antibiotics Other (See Comments)   FATIGUE        Medication List        Accurate as of January 13, 2023  2:58 PM. If you have any questions, ask your nurse or doctor.          brimonidine  0.2 % ophthalmic  solution Commonly known as: ALPHAGAN  Place 1 drop into the left eye 2 (two) times daily.   dorzolamidel-timolol  22.3-6.8 MG/ML Soln ophthalmic solution Commonly known as: COSOPT  Place 1 drop into both eyes 2 (two) times daily.   gabapentin 100 MG capsule Commonly known as: NEURONTIN Take 200 mg by mouth 3 (three) times daily as needed.   hydroxychloroquine  200 MG tablet Commonly known as: PLAQUENIL  Take 200 mg by mouth 2 (two) times daily. Sulfate   lansoprazole 30 MG capsule Commonly known as: PREVACID Take 30 mg by mouth daily.   latanoprost  0.005 % ophthalmic solution Commonly known as: XALATAN  Place 1 drop into both eyes at bedtime.   Linzess 72 MCG capsule Generic drug: linaclotide Take 72 mcg by mouth daily.   losartan 100 MG tablet Commonly known as: COZAAR Take 100 mg by mouth daily.   metFORMIN 500 MG 24 hr tablet Commonly known as: GLUCOPHAGE-XR SMARTSIG:2 Tablet(s) By Mouth Every Evening   OVER THE COUNTER MEDICATION Take 1 capsule by mouth in the morning and at bedtime. Bladder 2.2   OVER THE COUNTER MEDICATION Take 1 capsule by mouth daily. Theracran   oxybutynin  10 MG 24 hr tablet Commonly known as: DITROPAN -XL Take 1 tablet (10 mg total) by mouth daily.   rosuvastatin 20 MG tablet Commonly known as: CRESTOR Take 1 tablet by mouth daily.   tadalafil  10 MG tablet Commonly known as: Cialis  Take 1-2 tablets (10-20 mg total) by mouth daily as needed for erectile dysfunction (take 1 hour prior to sexual activity).   tamsulosin  0.4 MG Caps capsule Commonly known as: FLOMAX  Take 1 capsule (0.4 mg total) by mouth daily.        Allergies:  Allergies  Allergen Reactions   Ciprofloxacin  Itching   Levofloxacin  Itching    Other reaction(s): Other (See Comments) Extreme itching and his eyes felt like they had sand in them.   Sulfa Antibiotics Other (See Comments)    FATIGUE    Family History: Family History  Problem Relation Age of Onset    Diabetes Mother    Varicose Veins Mother    Cancer Father     Social History:   reports that he quit smoking about 28 years ago. His smoking use included cigarettes. He has been exposed to tobacco smoke. He has never used smokeless tobacco. He reports that he does not drink alcohol and does not use drugs.  Physical Exam: BP (!) 150/79   Pulse 88   Ht 5' 10 (1.778 m)   Wt 215 lb (97.5 kg)   BMI 30.85 kg/m   Constitutional:  Alert and oriented, no acute distress, nontoxic appearing HEENT: [place], AT Cardiovascular: No clubbing, cyanosis, or edema Respiratory: Normal respiratory effort, no increased work of breathing Skin: No rashes, bruises or suspicious lesions Neurologic: Grossly intact, no focal deficits, moving all 4 extremities Psychiatric: Normal mood and affect  Laboratory Data: Results for orders placed or performed in visit on 01/13/23  Microscopic Examination   Collection Time: 01/13/23  2:48 PM   Urine  Result Value Ref Range   WBC, UA 0-5 0 - 5 /hpf   RBC, Urine 0-2 0 - 2 /hpf   Epithelial Cells (non renal) 0-10 0 - 10 /hpf   Bacteria, UA None seen None seen/Few  Urinalysis, Complete   Collection Time: 01/13/23  2:48 PM  Result Value Ref Range   Specific Gravity, UA 1.020 1.005 - 1.030   pH, UA 5.5 5.0 - 7.5   Color, UA Yellow Yellow   Appearance Ur Clear Clear   Leukocytes,UA Negative Negative   Protein,UA Trace Negative/Trace   Glucose, UA 3+ (A) Negative   Ketones, UA Negative Negative   RBC, UA Negative Negative   Bilirubin, UA Negative Negative   Urobilinogen, Ur 0.2 0.2 - 1.0 mg/dL   Nitrite, UA Negative Negative   Microscopic Examination See below:    Pertinent Imaging: KUB, 01/12/2022: See Epic  I personally reviewed the images referenced above and no radiopaque ureteral stones; difficult to visualize kidneys due to overlying bowel contents.  Assessment & Plan:   1. Right renal stone (Primary) We discussed that ultrasound frequently overestimate  stone size and that his nonobstructing right renal stone is contralateral to his pain, so it is not likely to be the source of it.  I explained that I expect his pain on the left side is musculoskeletal in origin.  UA is bland, low suspicion for acute stone episode. - Urinalysis, Complete  2. OAB (overactive bladder) Worsening OAB symptoms on Jardiance and UA does have 3+ glucose today.  We discussed the role of SGLT2 inhibitors in irritative voiding symptoms.  Will switch him to Vesicare , unfortunately looks like his insurance will not cover beta 3 agonists. - solifenacin  (VESICARE ) 5 MG tablet; Take 1 tablet (5 mg total) by mouth daily.  Dispense: 90 tablet; Refill: 3  3. Elevated PSA Will recheck PSA prior to annual follow-up with Dr. Francisca. - PSA; Future   Return in about 6 weeks (around 02/24/2023) for Sx recheck, IPSS, PVR, PSA prior with Dr. Sninsky.  Lucie Hones, PA-C  [facility] [place] [address], [address] [place], [place] [address] [phone]

## 2023-01-23 ENCOUNTER — Other Ambulatory Visit: Payer: Self-pay

## 2023-01-23 DIAGNOSIS — N3281 Overactive bladder: Secondary | ICD-10-CM

## 2023-01-23 NOTE — Telephone Encounter (Signed)
Incoming refill request for Oxybutynin from [employer] [facility], RX denied as patient had been changed to [employer].

## 2023-02-24 ENCOUNTER — Other Ambulatory Visit: Payer: Self-pay

## 2023-02-25 ENCOUNTER — Encounter: Payer: Self-pay | Admitting: Urology

## 2023-03-11 ENCOUNTER — Other Ambulatory Visit: Payer: Self-pay

## 2023-03-11 DIAGNOSIS — N3281 Overactive bladder: Secondary | ICD-10-CM

## 2023-03-12 ENCOUNTER — Ambulatory Visit: Payer: Medicare Other | Admitting: Urology

## 2023-03-12 VITALS — BP 112/70 | HR 84 | Ht 72.0 in | Wt 215.0 lb

## 2023-03-12 DIAGNOSIS — R399 Unspecified symptoms and signs involving the genitourinary system: Secondary | ICD-10-CM

## 2023-03-12 DIAGNOSIS — C61 Malignant neoplasm of prostate: Secondary | ICD-10-CM | POA: Diagnosis not present

## 2023-03-12 DIAGNOSIS — R972 Elevated prostate specific antigen [PSA]: Secondary | ICD-10-CM | POA: Diagnosis not present

## 2023-03-12 DIAGNOSIS — N3281 Overactive bladder: Secondary | ICD-10-CM | POA: Diagnosis not present

## 2023-03-12 DIAGNOSIS — N529 Male erectile dysfunction, unspecified: Secondary | ICD-10-CM

## 2023-03-12 LAB — BLADDER SCAN AMB NON-IMAGING: Scan Result: 0

## 2023-03-12 MED ORDER — SOLIFENACIN SUCCINATE 10 MG PO TABS
10.0000 mg | ORAL_TABLET | Freq: Every day | ORAL | 11 refills | Status: AC
Start: 2023-03-12 — End: ?

## 2023-03-12 NOTE — Patient Instructions (Signed)
 Tadalafil(Cialis) is a medication to help with erections.  This needs to be taken at least 45 minutes prior to sexual activity, but stays in your bloodstream for up to 2 days.  The maximum dose is 20 mg a day, but you can start at 5 mg.  Some patients will take this medicine every other day so it is always in your bloodstream.  Tadalafil Tablets (Erectile Dysfunction, BPH) What is this medication? TADALAFIL (tah DA la fil) treats erectile dysfunction (ED). It works by increasing blood flow to the penis, which helps to maintain an erection. It may also be used to treat symptoms of an enlarged prostate (benign prostatic hyperplasia). This medicine may be used for other purposes; ask your health care provider or pharmacist if you have questions. COMMON BRAND NAME(S): Mady Gemma, Cialis What should I tell my care team before I take this medication? They need to know if you have any of these conditions: Abnormal penis shape or Peyronie disease Bleeding disorder Blood diseases, such as sickle cell anemia or leukemia Eye disease, such as retinitis pigmentosa Have had a heart attack Have had a painful and prolonged erection Have had a stroke Heart disease, such as angina, heart failure, irregular heartbeat or rhythm High or low blood pressure Stomach ulcers, other stomach or intestine problems Kidney disease Liver disease An unusual or allergic reaction to tadalafil, other medications, foods, dyes, or preservatives Pregnant or trying to get pregnant Breastfeeding How should I use this medication? Take this medication by mouth with a glass of water. Follow the directions on the prescription label. You may take this medication with or without meals. When this medication is used for erection problems, your care team may prescribe it to be taken once daily or as needed. If you are taking the medication as needed, you may be able to have sexual activity 30 minutes after taking it and for up to 36  hours after taking it. Whether you are taking the medication as needed or once daily, you should not take more than one dose per day. If you are taking this medication for symptoms of benign prostatic hyperplasia (BPH) or to treat both BPH and an erection problem, take the dose once daily at about the same time each day. Do not take your medication more often than directed. Talk to your care team about the use of this medication in children. Special care may be needed. Overdosage: If you think you have taken too much of this medicine contact a poison control center or emergency room at once. NOTE: This medicine is only for you. Do not share this medicine with others. What if I miss a dose? If you are taking this medication as needed for erection problems, this does not apply. If you miss a dose while taking this medication once daily for an erection problem, benign prostatic hyperplasia, or both, take it as soon as you remember, but do not take more than one dose per day. What may interact with this medication? Do not take this medication with any of the following: Nitrates, such as amyl nitrite, isosorbide dinitrate, isosorbide mononitrate, nitroglycerin Other medications for erectile dysfunction, such as avanafil, sildenafil, vardenafil Other tadalafil products Riociguat Vericiguat This medication may also interact with the following: Alcohol Certain antibiotics, such as clarithromycin or erythromycin Certain antivirals for HIV or hepatitis Certain medications for blood pressure Certain medications for fungal infections, such as fluconazole, itraconazole, ketoconazole, voriconazole Certain medications for seizures, such as carbamazepine, phenytoin, phenobarbital Grapefruit juice Medications  for prostate problems Rifabutin, rifampin, or rifapentine Other medications may affect the way this medication works. Talk with your care team about all of the medications you take. They may suggest  changes to your treatment plan to lower the risk of side effects and to make sure your medications work as intended. This list may not describe all possible interactions. Give your health care provider a list of all the medicines, herbs, non-prescription drugs, or dietary supplements you use. Also tell them if you smoke, drink alcohol, or use illegal drugs. Some items may interact with your medicine. What should I watch for while using this medication? Visit your care team for regular checks on your progress. Tell your care team if your symptoms do not start to get better or if they get worse. Using this medication does not protect you or your partner against HIV or other sexually transmitted infections (STIs). Stop and call your care team right away if you have symptoms such as nausea, dizziness, or chest pain during sex. Contact your care team right away if you have an erection that lasts longer than 4 hours or if it becomes painful. This may be a sign of a serious problem and must be treated right away to prevent permanent damage. What side effects may I notice from receiving this medication? Side effects that usually do not require medical attention (report to your care team if they continue or are bothersome): Back pain Facial flushing or redness Headache Muscle pain Runny or stuffy nose Upset stomach This list may not describe all possible side effects. Call your doctor for medical advice about side effects. You may report side effects to FDA at 1-800-FDA-1088. Where should I keep my medication? Keep out of the reach of children. Store at room temperature between 15 and 30 degrees C (59 and 86 degrees F). Throw away any unused medication after the expiration date. NOTE: This sheet is a summary. It may not cover all possible information. If you have questions about this medicine, talk to your doctor, pharmacist, or health care provider.  2024 Elsevier/Gold Standard (2022-04-11 00:00:00)

## 2023-03-12 NOTE — Progress Notes (Signed)
   03/12/2023 12:49 PM   Nicholas Vazquez. [birth year] [id number]  Reason for visit: Follow up history of prostate cancer, lower urinary tract symptoms, ED  HPI: 68 year old male previously followed by Dr. Sheppard Penton who transferred his care to [place] in March 2024 for the above issues. He was reportedly diagnosed with low risk Gleason score 3+3=6 prostate cancer in 2017 and treated with brachytherapy. He also apparently had a greenlight laser PVP around that time with Dr. Sheppard Penton. PSA has been very low since that time, most recently 0.05 in December 2023. He has had some bothersome urinary symptoms since that time, as well as ED.  He was recently started on diabetes medication which have exacerbated his urinary symptoms.  He was actually seen by our PA Carman Ching in January 2025, urinalysis showed glucosuria but no evidence of infection or microscopic hematuria.  Having nocturia 0-1 time at night but bothersome urgency and frequency during the day.  He was changed to Vesicare 5 mg daily to replace Detrol, x-ray showed no evidence of ureteral stone.  He remains on Flomax.  We reviewed behavioral strategies, he was interested in increasing the Vesicare to 10 mg daily.  In terms of ED he has had persistent problems since his radiation in 2017.  He only tried the Cialis once recently in the last year without good results.  We reviewed risks and benefits of Cialis extensively, and that he can take 5 to 20 mg on demand, or take a 10 mg daily dose with a 10 mg boost dose as needed.  Other options would be a trial of max dose sildenafil, or considering penile injections.  PSA today is pending, we will call with those results, low risk for recurrence, but will continue to monitor PSA yearly.  -Contact with PSA results -Vesicare dose increased to 10 mg daily, continue Flomax -Trial of Cialis 10 to 20 mg on demand for ED -He will contact us if no improvement on the Cialis or if worsening urinary  symptoms -Consider cystoscopy in the future if refractory urinary symptoms -RTC 1 year PVR, PSA prior   Sondra Come, MD  [facility] [address] [place], [place] [address] [phone]

## 2023-03-13 LAB — PSA: Prostate Specific Ag, Serum: <0.1 ng/mL (ref 0.0–4.0)

## 2023-04-27 ENCOUNTER — Other Ambulatory Visit: Payer: Self-pay | Admitting: Internal Medicine

## 2023-04-27 DIAGNOSIS — R0789 Other chest pain: Secondary | ICD-10-CM

## 2023-04-27 DIAGNOSIS — R0602 Shortness of breath: Secondary | ICD-10-CM

## 2023-05-06 ENCOUNTER — Telehealth (HOSPITAL_COMMUNITY): Payer: Self-pay | Admitting: *Deleted

## 2023-05-06 MED ORDER — METOPROLOL TARTRATE 100 MG PO TABS: ORAL_TABLET | ORAL | 0 refills | Status: AC

## 2023-05-06 NOTE — Telephone Encounter (Signed)
 Reaching out to patient to offer assistance regarding upcoming cardiac imaging study; pt verbalizes understanding of appt date/time, parking situation and where to check in, pre-test NPO status and medications ordered, and verified current allergies; name and call back number provided for further questions should they arise Johney Frame RN Navigator Cardiac Imaging [facility] [phone] office [phone] cell

## 2023-05-06 NOTE — Addendum Note (Signed)
 Addended by: Kerri Peed A on: 05/06/2023 02:58 PM   Modules accepted: Orders

## 2023-05-06 NOTE — Telephone Encounter (Signed)
 Attempted to call patient regarding upcoming cardiac CT appointment. Left message on voicemail with name and callback number Johney Frame RN Navigator Cardiac Imaging [facility] [phone] Office

## 2023-05-07 ENCOUNTER — Ambulatory Visit
Admission: RE | Admit: 2023-05-07 | Discharge: 2023-05-07 | Disposition: A | Discharge status: Home / Self Care | Source: Ambulatory Visit | Attending: Internal Medicine | Admitting: Internal Medicine

## 2023-05-07 ENCOUNTER — Other Ambulatory Visit (INDEPENDENT_AMBULATORY_CARE_PROVIDER_SITE_OTHER): Payer: Self-pay | Admitting: Nurse Practitioner

## 2023-05-07 DIAGNOSIS — I739 Peripheral vascular disease, unspecified: Secondary | ICD-10-CM

## 2023-05-07 DIAGNOSIS — R0602 Shortness of breath: Secondary | ICD-10-CM | POA: Insufficient documentation

## 2023-05-07 DIAGNOSIS — R0789 Other chest pain: Secondary | ICD-10-CM | POA: Insufficient documentation

## 2023-05-07 DIAGNOSIS — R230 Cyanosis: Secondary | ICD-10-CM

## 2023-05-07 MED ORDER — DILTIAZEM HCL 25 MG/5ML IV SOLN
10.0000 mg | INTRAVENOUS | Status: DC | PRN
  Administered 2023-05-07: 10 mg via INTRAVENOUS
  Filled 2023-05-07 (×2): qty 5

## 2023-05-07 MED ORDER — METOPROLOL TARTRATE 5 MG/5ML IV SOLN
INTRAVENOUS | Status: AC
  Filled 2023-05-07: qty 10

## 2023-05-07 MED ORDER — DILTIAZEM HCL 25 MG/5ML IV SOLN
INTRAVENOUS | Status: AC
  Filled 2023-05-07: qty 5

## 2023-05-07 MED ORDER — IOHEXOL 350 MG/ML SOLN
80.0000 mL | Freq: Once | INTRAVENOUS | Status: AC | PRN
  Administered 2023-05-07: 80 mL via INTRAVENOUS

## 2023-05-07 MED ORDER — NITROGLYCERIN 0.4 MG SL SUBL
0.8000 mg | SUBLINGUAL_TABLET | Freq: Once | SUBLINGUAL | Status: AC
  Administered 2023-05-07: 0.8 mg via SUBLINGUAL
  Filled 2023-05-07: qty 25

## 2023-05-07 MED ORDER — METOPROLOL TARTRATE 5 MG/5ML IV SOLN
10.0000 mg | Freq: Once | INTRAVENOUS | Status: AC | PRN
  Administered 2023-05-07: 10 mg via INTRAVENOUS
  Filled 2023-05-07: qty 10

## 2023-05-07 NOTE — Progress Notes (Signed)
Pt tolerated procedure well with no issues. Pt ABCs intact. Pt denies any complaints. Pt encouraged to drink plenty of water throughout the day. Pt ambulatory with steady gait.

## 2023-05-17 DIAGNOSIS — I739 Peripheral vascular disease, unspecified: Secondary | ICD-10-CM | POA: Insufficient documentation

## 2023-05-17 NOTE — Progress Notes (Unsigned)
 MRN : [id number]  Nicholas Vazquez. is a 68 y.o. (06-Feb-1955) male who presents with chief complaint of check circulation.  History of Present Illness: ***  No outpatient medications have been marked as taking for the 05/18/23 encounter (Appointment) with Prescilla Brod, Ninette Basque, MD.    Past Medical History:  Diagnosis Date   Abnormal ultrasound of prostate November 07, 2014   Acute back pain with sciatica    Midline Low back- Right-side   Arthritis    Lumbosacral DDD and  Rheumatoid   Cancer (HCC) 2017   PROSTATE   Chronic kidney disease    H/O KIDNEY STONES   Collagen vascular disease (HCC)    Lupus   Diabetes mellitus without complication (HCC)    Type II; pt stated it was due to steroid injections he was given-GLUCOSE NORMALIZED AFTER THAT   Enlarged prostate    GERD (gastroesophageal reflux disease)    Glaucoma    Bilateral   History of kidney stones    Hypertension    Lupus (HCC)     Past Surgical History:  Procedure Laterality Date   ABDOMINAL EXPOSURE N/A 11/09/2014   Procedure: ABDOMINAL EXPOSURE;  Surgeon: Dannis Dy, MD;  Location: [facility] OR;  Service: Vascular;  Laterality: N/A;   ANTERIOR LUMBAR FUSION N/A 11/09/2014   Procedure: ANTERIOR LUMBAR FUSION 1 LEVEL L5 - S1;  Surgeon: Mort Ards, MD;  Location: [facility] OR;  Service: Orthopedics;  Laterality: N/A;   COLONOSCOPY W/ POLYPECTOMY     EXTRACORPOREAL SHOCK WAVE LITHOTRIPSY Right 11/20/2016   Procedure: EXTRACORPOREAL SHOCK WAVE LITHOTRIPSY (ESWL);  Surgeon: Rea Cambridge, MD;  Location: [facility] ORS;  Service: Urology;  Laterality: Right;   EXTRACORPOREAL SHOCK WAVE LITHOTRIPSY Right 12/04/2016   Procedure: EXTRACORPOREAL SHOCK WAVE LITHOTRIPSY (ESWL);  Surgeon: Rea Cambridge, MD;  Location: [facility] ORS;  Service: Urology;  Laterality: Right;   FOOT SURGERY Bilateral    GREEN LIGHT LASER TURP (TRANSURETHRAL RESECTION OF PROSTATE  N/A 01/30/2015   Procedure: GREEN LIGHT LASER TURP (TRANSURETHRAL RESECTION OF PROSTATE;  Surgeon: Rea Cambridge, MD;  Location: [facility] ORS;  Service: Urology;  Laterality: N/A;   LEFT HEART CATH AND CORONARY ANGIOGRAPHY Left 05/09/2020   Procedure: LEFT HEART CATH AND CORONARY ANGIOGRAPHY;  Surgeon: Antonette Batters, MD;  Location: [facility] INVASIVE CV LAB;  Service: Cardiovascular;  Laterality: Left;   PROSTATE BIOPSY  October 2016   TRANSURETHRAL RESECTION OF BLADDER TUMOR N/A 02/24/2017   Procedure: TRANSURETHRAL RESECTION OF BLADDER TUMOR (TURBT);  Surgeon: Rea Cambridge, MD;  Location: [facility] ORS;  Service: Urology;  Laterality: N/A;   TUMOR REMOVAL      Social History Social History   Tobacco Use   Smoking status: Former    Current packs/day: 0.00    Types: Cigarettes    Quit date: 01/21/1994    Years since quitting: 29.3    Passive exposure: Past   Smokeless tobacco: Never   Tobacco comments:    SOCIAL SMOKER ONLY  Vaping Use   Vaping status: Never Used  Substance Use Topics   Alcohol use: No  Alcohol/week: 0.0 standard drinks of alcohol   Drug use: No    Family History Family History  Problem Relation Age of Onset   Diabetes Mother    Varicose Veins Mother    Cancer Father     Allergies  Allergen Reactions   Ciprofloxacin  Itching   Levofloxacin  Itching    Other reaction(s): Other (See Comments) Extreme itching and his eyes felt like they had sand in them.   Sulfa Antibiotics Other (See Comments)    FATIGUE     REVIEW OF SYSTEMS (Negative unless checked)  Constitutional: [] Weight loss  [] Fever  [] Chills Cardiac: [] Chest pain   [] Chest pressure   [] Palpitations   [] Shortness of breath when laying flat   [] Shortness of breath with exertion. Vascular:  [x] Pain in legs with walking   [] Pain in legs at rest  [] History of DVT   [] Phlebitis   [] Swelling in legs   [] Varicose veins   [] Non-healing ulcers Pulmonary:   [] Uses home oxygen   [] Productive cough    [] Hemoptysis   [] Wheeze  [] COPD   [] Asthma Neurologic:  [] Dizziness   [] Seizures   [] History of stroke   [] History of TIA  [] Aphasia   [] Vissual changes   [] Weakness or numbness in arm   [] Weakness or numbness in leg Musculoskeletal:   [] Joint swelling   [] Joint pain   [] Low back pain Hematologic:  [] Easy bruising  [] Easy bleeding   [] Hypercoagulable state   [] Anemic Gastrointestinal:  [] Diarrhea   [] Vomiting  [] Gastroesophageal reflux/heartburn   [] Difficulty swallowing. Genitourinary:  [] Chronic kidney disease   [] Difficult urination  [] Frequent urination   [] Blood in urine Skin:  [] Rashes   [] Ulcers  Psychological:  [] History of anxiety   []  History of major depression.  Physical Examination  There were no vitals filed for this visit. There is no height or weight on file to calculate BMI. Gen: WD/WN, NAD Head: [place]/AT, No temporalis wasting.  Ear/Nose/Throat: Hearing grossly intact, nares w/o erythema or drainage Eyes: PER, EOMI, sclera nonicteric.  Neck: Supple, no masses.  No bruit or JVD.  Pulmonary:  Good air movement, no audible wheezing, no use of accessory muscles.  Cardiac: RRR, normal S1, S2, no Murmurs. Vascular:  mild trophic changes, no open wounds Vessel Right Left  Radial Palpable Palpable  PT Not Palpable Not Palpable  DP Not Palpable Not Palpable  Gastrointestinal: soft, non-distended. No guarding/no peritoneal signs.  Musculoskeletal: M/S 5/5 throughout.  No visible deformity.  Neurologic: CN 2-12 intact. Pain and light touch intact in extremities.  Symmetrical.  Speech is fluent. Motor exam as listed above. Psychiatric: Judgment intact, Mood & affect appropriate for pt's clinical situation. Dermatologic: No rashes or ulcers noted.  No changes consistent with cellulitis.   CBC Lab Results  Component Value Date   WBC 8.0 11/13/2018   HGB 11.3 (L) 11/13/2018   HCT 34.3 (L) 11/13/2018   MCV 95.0 11/13/2018   PLT 235 11/13/2018    BMET    Component Value  Date/Time   NA 141 11/13/2018 0643   NA 144 04/13/2011 1743   K 4.0 11/13/2018 0643   K 3.5 04/13/2011 1743   CL 109 11/13/2018 0643   CL 107 04/13/2011 1743   CO2 23 11/13/2018 0643   CO2 28 04/13/2011 1743   GLUCOSE 134 (H) 11/13/2018 0643   GLUCOSE 94 04/13/2011 1743   BUN 15 11/13/2018 0643   BUN 12 04/13/2011 1743   CREATININE 1.10 12/28/2018 1028   CREATININE 1.06 04/13/2011 1743  CALCIUM 8.8 (L) 11/13/2018 0643   CALCIUM 8.7 04/13/2011 1743   GFRNONAA >60 11/13/2018 0643   GFRNONAA >60 04/13/2011 1743   GFRAA >60 11/13/2018 0643   GFRAA >60 04/13/2011 1743   CrCl cannot be calculated (Patient's most recent lab result is older than the maximum 21 days allowed.).  COAG No results found for: "INR", "PROTIME"  Radiology CT CORONARY MORPH W/CTA COR W/SCORE W/CA W/CM &/OR WO/CM Result Date: 05/07/2023 CLINICAL DATA:  Chest pain EXAM: Cardiac/Coronary  CTA TECHNIQUE: The patient was scanned on a Siemens Somatom scanner. : A retrospective scan was triggered in the ascending thoracic aorta. Axial non-contrast 3 mm slices were carried out through the heart. The data set was analyzed on a dedicated work station and scored using the Agatson method. Gantry rotation speed was 66 msecs and collimation was .6 mm. 0.8 mg of sl NTG was given. The 3D data set was reconstructed in 5% intervals of the 60-95 % of the R-R cycle. Diastolic phases were analyzed on a dedicated work station using MPR, MIP and VRT modes. The patient received 80 cc of contrast. FINDINGS: Aorta:  Normal size.  No calcifications.  No dissection. Aortic Valve:  Trileaflet. Mild calcifications. Coronary Arteries:  Normal coronary origin.  Right dominance. RCA is a dominant artery. There is no plaque. Left main gives rise to LAD and LCX arteries. LM has no disease. LAD has no plaque. LCX is a non-dominant artery.  There is no plaque. Other findings: Normal pulmonary vein drainage into the left atrium. Normal left atrial appendage  without a thrombus. Normal size of the pulmonary artery. IMPRESSION: 1. Coronary calcium score of 0. 2. Normal coronary origin with right dominance. 3. No evidence of CAD. 4. CAD-RADS 0. Consider non-atherosclerotic causes of chest pain. Electronically Signed   By: Constancia Delton M.D.   On: 05/07/2023 15:29     Assessment/Plan 1. PAD (peripheral artery disease) (HCC) (Primary) ***  2. Type 2 diabetes mellitus with chronic kidney disease, without long-term current use of insulin, unspecified CKD stage (HCC) ***  3. Primary osteoarthritis involving multiple joints ***    Devon Fogo, MD  05/17/2023 7:56 PM

## 2023-05-18 ENCOUNTER — Ambulatory Visit (INDEPENDENT_AMBULATORY_CARE_PROVIDER_SITE_OTHER): Admitting: Vascular Surgery

## 2023-05-18 ENCOUNTER — Encounter (INDEPENDENT_AMBULATORY_CARE_PROVIDER_SITE_OTHER): Payer: Self-pay | Admitting: Vascular Surgery

## 2023-05-18 ENCOUNTER — Ambulatory Visit (INDEPENDENT_AMBULATORY_CARE_PROVIDER_SITE_OTHER)

## 2023-05-18 VITALS — BP 117/71 | HR 82 | Resp 16 | Ht 72.0 in | Wt 210.0 lb

## 2023-05-18 DIAGNOSIS — I739 Peripheral vascular disease, unspecified: Secondary | ICD-10-CM

## 2023-05-18 DIAGNOSIS — M79604 Pain in right leg: Secondary | ICD-10-CM

## 2023-05-18 DIAGNOSIS — I8310 Varicose veins of unspecified lower extremity with inflammation: Secondary | ICD-10-CM | POA: Diagnosis not present

## 2023-05-18 DIAGNOSIS — R230 Cyanosis: Secondary | ICD-10-CM | POA: Diagnosis not present

## 2023-05-18 DIAGNOSIS — M150 Primary generalized (osteo)arthritis: Secondary | ICD-10-CM | POA: Diagnosis not present

## 2023-05-18 DIAGNOSIS — E119 Type 2 diabetes mellitus without complications: Secondary | ICD-10-CM | POA: Insufficient documentation

## 2023-05-18 DIAGNOSIS — M79606 Pain in leg, unspecified: Secondary | ICD-10-CM | POA: Insufficient documentation

## 2023-05-18 DIAGNOSIS — M79605 Pain in left leg: Secondary | ICD-10-CM

## 2023-05-18 DIAGNOSIS — E1140 Type 2 diabetes mellitus with diabetic neuropathy, unspecified: Secondary | ICD-10-CM

## 2023-05-18 DIAGNOSIS — E1122 Type 2 diabetes mellitus with diabetic chronic kidney disease: Secondary | ICD-10-CM

## 2023-05-26 ENCOUNTER — Encounter (INDEPENDENT_AMBULATORY_CARE_PROVIDER_SITE_OTHER): Payer: Self-pay

## 2023-07-27 DIAGNOSIS — R8290 Unspecified abnormal findings in urine: Secondary | ICD-10-CM | POA: Diagnosis not present

## 2023-07-27 DIAGNOSIS — R300 Dysuria: Secondary | ICD-10-CM | POA: Diagnosis not present

## 2023-07-27 DIAGNOSIS — R350 Frequency of micturition: Secondary | ICD-10-CM | POA: Diagnosis not present

## 2023-08-03 DIAGNOSIS — J449 Chronic obstructive pulmonary disease, unspecified: Secondary | ICD-10-CM | POA: Diagnosis not present

## 2023-08-03 DIAGNOSIS — I10 Essential (primary) hypertension: Secondary | ICD-10-CM | POA: Diagnosis not present

## 2023-08-03 DIAGNOSIS — G479 Sleep disorder, unspecified: Secondary | ICD-10-CM | POA: Diagnosis not present

## 2023-08-03 DIAGNOSIS — E785 Hyperlipidemia, unspecified: Secondary | ICD-10-CM | POA: Diagnosis not present

## 2023-08-03 DIAGNOSIS — E1129 Type 2 diabetes mellitus with other diabetic kidney complication: Secondary | ICD-10-CM | POA: Diagnosis not present

## 2023-08-03 DIAGNOSIS — J301 Allergic rhinitis due to pollen: Secondary | ICD-10-CM | POA: Diagnosis not present

## 2023-08-03 DIAGNOSIS — Z1331 Encounter for screening for depression: Secondary | ICD-10-CM | POA: Diagnosis not present

## 2023-08-03 DIAGNOSIS — M3214 Glomerular disease in systemic lupus erythematosus: Secondary | ICD-10-CM | POA: Diagnosis not present

## 2023-08-03 DIAGNOSIS — G4719 Other hypersomnia: Secondary | ICD-10-CM | POA: Diagnosis not present

## 2023-08-03 DIAGNOSIS — R809 Proteinuria, unspecified: Secondary | ICD-10-CM | POA: Diagnosis not present

## 2023-08-03 DIAGNOSIS — K219 Gastro-esophageal reflux disease without esophagitis: Secondary | ICD-10-CM | POA: Diagnosis not present

## 2023-08-05 DIAGNOSIS — E1129 Type 2 diabetes mellitus with other diabetic kidney complication: Secondary | ICD-10-CM | POA: Diagnosis not present

## 2023-08-05 DIAGNOSIS — E785 Hyperlipidemia, unspecified: Secondary | ICD-10-CM | POA: Diagnosis not present

## 2023-08-05 DIAGNOSIS — I10 Essential (primary) hypertension: Secondary | ICD-10-CM | POA: Diagnosis not present

## 2023-08-05 DIAGNOSIS — R809 Proteinuria, unspecified: Secondary | ICD-10-CM | POA: Diagnosis not present

## 2023-08-05 DIAGNOSIS — M3214 Glomerular disease in systemic lupus erythematosus: Secondary | ICD-10-CM | POA: Diagnosis not present

## 2023-08-06 DIAGNOSIS — R1319 Other dysphagia: Secondary | ICD-10-CM | POA: Diagnosis not present

## 2023-08-06 DIAGNOSIS — Z860101 Personal history of adenomatous and serrated colon polyps: Secondary | ICD-10-CM | POA: Diagnosis not present

## 2023-08-06 DIAGNOSIS — K219 Gastro-esophageal reflux disease without esophagitis: Secondary | ICD-10-CM | POA: Diagnosis not present

## 2023-08-06 DIAGNOSIS — K5909 Other constipation: Secondary | ICD-10-CM | POA: Diagnosis not present

## 2023-08-21 ENCOUNTER — Ambulatory Visit: Admitting: Podiatry

## 2023-08-21 DIAGNOSIS — L84 Corns and callosities: Secondary | ICD-10-CM | POA: Diagnosis not present

## 2023-08-21 DIAGNOSIS — E1151 Type 2 diabetes mellitus with diabetic peripheral angiopathy without gangrene: Secondary | ICD-10-CM

## 2023-08-21 DIAGNOSIS — M205X1 Other deformities of toe(s) (acquired), right foot: Secondary | ICD-10-CM

## 2023-08-21 DIAGNOSIS — E119 Type 2 diabetes mellitus without complications: Secondary | ICD-10-CM

## 2023-08-21 DIAGNOSIS — B351 Tinea unguium: Secondary | ICD-10-CM

## 2023-08-21 DIAGNOSIS — M205X2 Other deformities of toe(s) (acquired), left foot: Secondary | ICD-10-CM

## 2023-08-21 DIAGNOSIS — M79675 Pain in left toe(s): Secondary | ICD-10-CM

## 2023-08-21 DIAGNOSIS — M79674 Pain in right toe(s): Secondary | ICD-10-CM

## 2023-08-24 DIAGNOSIS — M329 Systemic lupus erythematosus, unspecified: Secondary | ICD-10-CM | POA: Diagnosis not present

## 2023-08-24 DIAGNOSIS — N62 Hypertrophy of breast: Secondary | ICD-10-CM | POA: Diagnosis not present

## 2023-08-24 DIAGNOSIS — E7800 Pure hypercholesterolemia, unspecified: Secondary | ICD-10-CM | POA: Diagnosis not present

## 2023-08-24 DIAGNOSIS — E1122 Type 2 diabetes mellitus with diabetic chronic kidney disease: Secondary | ICD-10-CM | POA: Diagnosis not present

## 2023-08-24 DIAGNOSIS — N181 Chronic kidney disease, stage 1: Secondary | ICD-10-CM | POA: Diagnosis not present

## 2023-08-25 ENCOUNTER — Other Ambulatory Visit: Payer: Self-pay | Admitting: Internal Medicine

## 2023-08-25 DIAGNOSIS — N62 Hypertrophy of breast: Secondary | ICD-10-CM

## 2023-08-27 ENCOUNTER — Encounter: Payer: Self-pay | Admitting: Podiatry

## 2023-08-27 NOTE — Progress Notes (Signed)
 ANNUAL DIABETIC FOOT EXAM  Subjective: Nicholas Vazquez. presents today for annual diabetic foot exam. Chief Complaint  Patient presents with   Nail Problem    Thick painful toenails,  overdue 3 month follow up His wife has been trimming his nails and calluses, but she has become uncomfortable doing so   Diabetes    Diabetic foot exam due   Patient confirms h/o diabetes. He has been diagnosed with PAD and is followed by [facility]. Recently diagnosed with pseudoclaudication.  Patient denies any h/o foot wounds.  Nicholas Vazquez ORN, MD is patient's PCP.  Past Medical History:  Diagnosis Date   Abnormal ultrasound of prostate November 07, 2014   Acute back pain with sciatica    Midline Low back- Right-side   Arthritis    Lumbosacral DDD and  Rheumatoid   Cancer (HCC) 2017   PROSTATE   Chronic kidney disease    H/O KIDNEY STONES   Collagen vascular disease (HCC)    Lupus   Diabetes mellitus without complication (HCC)    Type II; pt stated it was due to steroid injections he was given-GLUCOSE NORMALIZED AFTER THAT   Enlarged prostate    GERD (gastroesophageal reflux disease)    Glaucoma    Bilateral   History of kidney stones    Hypertension    Lupus    Patient Active Problem List   Diagnosis Date Noted   Diabetes (HCC) 05/18/2023   Leg pain 05/18/2023   Varicose veins with inflammation 05/18/2023   PAD (peripheral artery disease) (HCC) 05/17/2023   Nasal congestion 10/17/2021   Callus 01/05/2020   Anal skin tag 11/18/2018   Grade I hemorrhoids 11/18/2018   Headache disorder 03/30/2018   Carcinoma of prostate (HCC) 03/23/2018   Hypertensive disorder 03/23/2018   OA (osteoarthritis) 01/13/2017   Cancer of prostate (HCC) 03/27/2015   Health care maintenance 03/27/2015   Back pain with sciatica 11/09/2014   Glaucoma 04/11/2013   Iritis 04/11/2013   Kidney stones 04/11/2013   Lipoma of scalp 04/11/2013   Lipoma of shoulder  04/11/2013   Lupus 04/11/2013   Type 2 diabetes mellitus with chronic kidney disease (HCC) 04/11/2013   Past Surgical History:  Procedure Laterality Date   ABDOMINAL EXPOSURE N/A 11/09/2014   Procedure: ABDOMINAL EXPOSURE;  Surgeon: Lonni GORMAN Blade, MD;  Location: [facility] OR;  Service: Vascular;  Laterality: N/A;   ANTERIOR LUMBAR FUSION N/A 11/09/2014   Procedure: ANTERIOR LUMBAR FUSION 1 LEVEL L5 - S1;  Surgeon: Donaciano Sprang, MD;  Location: [facility] OR;  Service: Orthopedics;  Laterality: N/A;   COLONOSCOPY W/ POLYPECTOMY     EXTRACORPOREAL SHOCK WAVE LITHOTRIPSY Right 11/20/2016   Procedure: EXTRACORPOREAL SHOCK WAVE LITHOTRIPSY (ESWL);  Surgeon: Kassie Ozell SAUNDERS, MD;  Location: [facility] ORS;  Service: Urology;  Laterality: Right;   EXTRACORPOREAL SHOCK WAVE LITHOTRIPSY Right 12/04/2016   Procedure: EXTRACORPOREAL SHOCK WAVE LITHOTRIPSY (ESWL);  Surgeon: Kassie Ozell SAUNDERS, MD;  Location: [facility] ORS;  Service: Urology;  Laterality: Right;   FOOT SURGERY Bilateral    GREEN LIGHT LASER TURP (TRANSURETHRAL RESECTION OF PROSTATE N/A 01/30/2015   Procedure: GREEN LIGHT LASER TURP (TRANSURETHRAL RESECTION OF PROSTATE;  Surgeon: Ozell SAUNDERS Kassie, MD;  Location: [facility] ORS;  Service: Urology;  Laterality: N/A;   LEFT HEART CATH AND CORONARY ANGIOGRAPHY Left 05/09/2020   Procedure: LEFT HEART CATH AND CORONARY ANGIOGRAPHY;  Surgeon: Florencio Cara BIRCH, MD;  Location: [facility];  Service: Cardiovascular;  Laterality: Left;  PROSTATE BIOPSY  October 2016   TRANSURETHRAL RESECTION OF BLADDER TUMOR N/A 02/24/2017   Procedure: TRANSURETHRAL RESECTION OF BLADDER TUMOR (TURBT);  Surgeon: Kassie Ozell SAUNDERS, MD;  Location: [facility] ORS;  Service: Urology;  Laterality: N/A;   TUMOR REMOVAL     Current Outpatient Medications on File Prior to Visit  Medication Sig Dispense Refill   brimonidine  (ALPHAGAN ) 0.2 % ophthalmic solution Place 1 drop into the left eye 2 (two) times daily.      Dorzolamide  HCl-Timolol  Mal PF  22.3-6.8 MG/ML SOLN Place 1 drop into both eyes 2 (two) times daily.      gabapentin (NEURONTIN) 100 MG capsule Take 200 mg by mouth 3 (three) times daily as needed.     hydroxychloroquine  (PLAQUENIL ) 200 MG tablet Take 200 mg by mouth 2 (two) times daily. Sulfate  5   JARDIANCE 25 MG TABS tablet Take 25 mg by mouth daily.     lansoprazole (PREVACID) 30 MG capsule Take 30 mg by mouth daily.     latanoprost  (XALATAN ) 0.005 % ophthalmic solution Place 1 drop into both eyes at bedtime.      LINZESS 72 MCG capsule Take 72 mcg by mouth daily.     losartan (COZAAR) 100 MG tablet Take 100 mg by mouth daily.     metFORMIN (GLUCOPHAGE-XR) 500 MG 24 hr tablet SMARTSIG:2 Tablet(s) By Mouth Every Evening     metoprolol  tartrate (LOPRESSOR ) 100 MG tablet Take 1 tablet (100mg ) TWO hours prior to CT scan 1 tablet 0   OVER THE COUNTER MEDICATION Take 1 capsule by mouth in the morning and at bedtime. Bladder 2.2     OVER THE COUNTER MEDICATION Take 1 capsule by mouth daily. Theracran     rosuvastatin (CRESTOR) 20 MG tablet Take 1 tablet by mouth daily.     solifenacin  (VESICARE ) 10 MG tablet Take 1 tablet (10 mg total) by mouth daily. 30 tablet 11   spironolactone (ALDACTONE) 25 MG tablet Take by mouth.     tadalafil  (CIALIS ) 10 MG tablet Take 1-2 tablets (10-20 mg total) by mouth daily as needed for erectile dysfunction (take 1 hour prior to sexual activity). 30 tablet 6   tamsulosin  (FLOMAX ) 0.4 MG CAPS capsule Take 1 capsule (0.4 mg total) by mouth daily. 90 capsule 3   No current facility-administered medications on file prior to visit.    Allergies  Allergen Reactions   Ciprofloxacin  Itching    Cipro    Levofloxacin  Itching and Other (See Comments)    Other reaction(s): Other (See Comments)  Extreme itching and his eyes felt like they had sand in them.   Sulfa Antibiotics Other (See Comments)    FATIGUE   Social History   Occupational History   Not on file  Tobacco Use   Smoking status: Former     Current packs/day: 0.00    Types: Cigarettes    Quit date: 01/21/1994    Years since quitting: 29.6    Passive exposure: Past   Smokeless tobacco: Never   Tobacco comments:    SOCIAL SMOKER ONLY  Vaping Use   Vaping status: Never Used  Substance and Sexual Activity   Alcohol use: No    Alcohol/week: 0.0 standard drinks of alcohol   Drug use: No   Sexual activity: Not on file   Family History  Problem Relation Age of Onset   Diabetes Mother    Varicose Veins Mother    Cancer Father    Immunization History  Administered Date(s) Administered  Fluzone Influenza virus vaccine,trivalent (IIV3), split virus 11/04/2013   PFIZER(Purple Top)SARS-COV-2 Vaccination 04/01/2019, 04/27/2019     Review of Systems: Negative except as noted in the HPI.   Objective: There were no vitals filed for this visit.  Raeford Brandenburg. is a pleasant 68 y.o. male in NAD. AAO X 3.  Diabetic foot exam was performed with the following findings:   No deformities, ulcerations, or other skin breakdown Normal sensation of 10g monofilament Vascular Examination: CFT <4 seconds b/l LE. Diminished pedal pulses b/l LE. Pedal hair absent. No pain with calf compression b/l. Lower extremity skin temperature gradient within normal limits. Trace edema noted BLE. Varicosities present b/l. Evidence of chronic venous insufficiency b/l LE.  Neurological Examination: Sensation grossly intact b/l with 10 gram monofilament. Vibratory sensation intact b/l.   Dermatological Examination: Pedal skin with normal turgor, texture and tone b/l.  No open wounds. No interdigital macerations.   Toenails 1-5 b/l thick, discolored, elongated with subungual debris and pain on dorsal palpation.   Hyperkeratotic lesion(s) medial IPJ of left great toe, medial IPJ of right great toe, submet head 4 right foot, and submet head 5 right foot.  No erythema, no edema, no drainage, no fluctuance.  Musculoskeletal  Examination: Muscle strength 5/5 to all lower extremity muscle groups bilaterally. Limited joint ROM to the 1st MPJ b/l. Patient ambulates independent of any assistive aids.  Radiographs: None     Lab Results  Component Value Date   HGBA1C 5.9 (H) 10/31/2014   ADA FOOT RISK CATEGORIZATION High Risk:  Patient has one or more of the following: Loss of protective sensation Absent pedal pulses Severe Foot deformity History of foot ulcer   Assessment: 1. Pain due to onychomycosis of toenails of both feet   2. Callus   3. Hallux limitus of left foot   4. Hallux limitus of right foot   5. Type II diabetes mellitus with peripheral circulatory disorder (HCC)   6. Encounter for diabetic foot exam (HCC)     Plan: Diabetic foot examination performed today. All patient's and/or POA's questions/concerns addressed on today's visit. Toenails 1-5 debrided in length and girth without incident. Callus(es) medial IPJ of left great toe, medial IPJ of right great toe, submet head 4 right foot, and submet head 5 right foot pared with sharp debridement without incident. Continue daily foot inspections and monitor blood glucose per PCP/Endocrinologist's recommendations.Continue soft, supportive shoe gear daily. Report any pedal injuries to medical professional. Call office if there are any questions/concerns. -Patient/POA to call should there be question/concern in the interim. Return in about 3 months (around 11/21/2023).  Delon LITTIE Merlin, DPM       LOCATION: [address], [place] [address]                   Office [phone]   [facility] LOCATION: [address] [place], [place] [address] Office [phone]

## 2023-08-28 ENCOUNTER — Ambulatory Visit
Admission: RE | Admit: 2023-08-28 | Discharge: 2023-08-28 | Disposition: A | Discharge status: Home / Self Care | Source: Ambulatory Visit | Attending: Internal Medicine | Admitting: Internal Medicine

## 2023-08-28 DIAGNOSIS — N62 Hypertrophy of breast: Secondary | ICD-10-CM | POA: Diagnosis not present

## 2023-08-28 DIAGNOSIS — R92323 Mammographic fibroglandular density, bilateral breasts: Secondary | ICD-10-CM | POA: Diagnosis not present

## 2023-09-02 ENCOUNTER — Encounter: Payer: Self-pay | Admitting: Urology

## 2023-09-03 ENCOUNTER — Ambulatory Visit: Payer: Self-pay

## 2023-09-03 DIAGNOSIS — Z860100 Personal history of colon polyps, unspecified: Secondary | ICD-10-CM | POA: Diagnosis not present

## 2023-09-03 DIAGNOSIS — Z860101 Personal history of adenomatous and serrated colon polyps: Secondary | ICD-10-CM | POA: Diagnosis not present

## 2023-09-03 DIAGNOSIS — Z09 Encounter for follow-up examination after completed treatment for conditions other than malignant neoplasm: Secondary | ICD-10-CM | POA: Diagnosis not present

## 2023-09-03 DIAGNOSIS — R1314 Dysphagia, pharyngoesophageal phase: Secondary | ICD-10-CM | POA: Diagnosis not present

## 2023-09-03 DIAGNOSIS — K219 Gastro-esophageal reflux disease without esophagitis: Secondary | ICD-10-CM | POA: Diagnosis not present

## 2023-09-03 DIAGNOSIS — K2100 Gastro-esophageal reflux disease with esophagitis, without bleeding: Secondary | ICD-10-CM | POA: Diagnosis not present

## 2023-09-03 DIAGNOSIS — D123 Benign neoplasm of transverse colon: Secondary | ICD-10-CM | POA: Diagnosis not present

## 2023-09-03 DIAGNOSIS — K640 First degree hemorrhoids: Secondary | ICD-10-CM | POA: Diagnosis not present

## 2023-09-04 DIAGNOSIS — Z79899 Other long term (current) drug therapy: Secondary | ICD-10-CM | POA: Diagnosis not present

## 2023-09-04 DIAGNOSIS — M3214 Glomerular disease in systemic lupus erythematosus: Secondary | ICD-10-CM | POA: Diagnosis not present

## 2023-09-04 DIAGNOSIS — M329 Systemic lupus erythematosus, unspecified: Secondary | ICD-10-CM | POA: Diagnosis not present

## 2023-09-04 DIAGNOSIS — Z1159 Encounter for screening for other viral diseases: Secondary | ICD-10-CM | POA: Diagnosis not present

## 2023-10-22 DIAGNOSIS — G4733 Obstructive sleep apnea (adult) (pediatric): Secondary | ICD-10-CM | POA: Diagnosis not present

## 2023-10-26 DIAGNOSIS — E7800 Pure hypercholesterolemia, unspecified: Secondary | ICD-10-CM | POA: Diagnosis not present

## 2023-10-26 DIAGNOSIS — E1122 Type 2 diabetes mellitus with diabetic chronic kidney disease: Secondary | ICD-10-CM | POA: Diagnosis not present

## 2023-10-26 DIAGNOSIS — N181 Chronic kidney disease, stage 1: Secondary | ICD-10-CM | POA: Diagnosis not present

## 2023-11-02 DIAGNOSIS — J301 Allergic rhinitis due to pollen: Secondary | ICD-10-CM | POA: Diagnosis not present

## 2023-11-02 DIAGNOSIS — G479 Sleep disorder, unspecified: Secondary | ICD-10-CM | POA: Diagnosis not present

## 2023-11-02 DIAGNOSIS — K219 Gastro-esophageal reflux disease without esophagitis: Secondary | ICD-10-CM | POA: Diagnosis not present

## 2023-11-02 DIAGNOSIS — J449 Chronic obstructive pulmonary disease, unspecified: Secondary | ICD-10-CM | POA: Diagnosis not present

## 2023-11-04 DIAGNOSIS — E1129 Type 2 diabetes mellitus with other diabetic kidney complication: Secondary | ICD-10-CM | POA: Diagnosis not present

## 2023-11-04 DIAGNOSIS — R809 Proteinuria, unspecified: Secondary | ICD-10-CM | POA: Diagnosis not present

## 2023-11-04 DIAGNOSIS — M3214 Glomerular disease in systemic lupus erythematosus: Secondary | ICD-10-CM | POA: Diagnosis not present

## 2023-11-04 DIAGNOSIS — I10 Essential (primary) hypertension: Secondary | ICD-10-CM | POA: Diagnosis not present

## 2023-11-04 DIAGNOSIS — E785 Hyperlipidemia, unspecified: Secondary | ICD-10-CM | POA: Diagnosis not present

## 2023-11-12 ENCOUNTER — Other Ambulatory Visit: Payer: Self-pay | Admitting: Nephrology

## 2023-11-12 DIAGNOSIS — R809 Proteinuria, unspecified: Secondary | ICD-10-CM

## 2023-11-12 DIAGNOSIS — E1129 Type 2 diabetes mellitus with other diabetic kidney complication: Secondary | ICD-10-CM

## 2023-11-12 DIAGNOSIS — E1169 Type 2 diabetes mellitus with other specified complication: Secondary | ICD-10-CM

## 2023-11-12 DIAGNOSIS — I10 Essential (primary) hypertension: Secondary | ICD-10-CM

## 2023-11-12 DIAGNOSIS — M3214 Glomerular disease in systemic lupus erythematosus: Secondary | ICD-10-CM

## 2023-11-20 ENCOUNTER — Other Ambulatory Visit (INDEPENDENT_AMBULATORY_CARE_PROVIDER_SITE_OTHER): Payer: Self-pay | Admitting: Vascular Surgery

## 2023-11-20 DIAGNOSIS — M79604 Pain in right leg: Secondary | ICD-10-CM

## 2023-11-20 DIAGNOSIS — I739 Peripheral vascular disease, unspecified: Secondary | ICD-10-CM

## 2023-11-23 ENCOUNTER — Ambulatory Visit (INDEPENDENT_AMBULATORY_CARE_PROVIDER_SITE_OTHER)

## 2023-11-23 ENCOUNTER — Encounter (INDEPENDENT_AMBULATORY_CARE_PROVIDER_SITE_OTHER): Payer: Self-pay | Admitting: Vascular Surgery

## 2023-11-23 ENCOUNTER — Ambulatory Visit (INDEPENDENT_AMBULATORY_CARE_PROVIDER_SITE_OTHER): Admitting: Vascular Surgery

## 2023-11-23 ENCOUNTER — Other Ambulatory Visit: Payer: Self-pay | Admitting: Radiology

## 2023-11-23 VITALS — BP 110/69 | HR 78 | Resp 17 | Ht 72.0 in | Wt 210.0 lb

## 2023-11-23 DIAGNOSIS — M5430 Sciatica, unspecified side: Secondary | ICD-10-CM

## 2023-11-23 DIAGNOSIS — M79605 Pain in left leg: Secondary | ICD-10-CM

## 2023-11-23 DIAGNOSIS — I739 Peripheral vascular disease, unspecified: Secondary | ICD-10-CM

## 2023-11-23 DIAGNOSIS — E1122 Type 2 diabetes mellitus with diabetic chronic kidney disease: Secondary | ICD-10-CM

## 2023-11-23 DIAGNOSIS — M79604 Pain in right leg: Secondary | ICD-10-CM

## 2023-11-23 DIAGNOSIS — I8310 Varicose veins of unspecified lower extremity with inflammation: Secondary | ICD-10-CM

## 2023-11-23 DIAGNOSIS — M549 Dorsalgia, unspecified: Secondary | ICD-10-CM | POA: Diagnosis not present

## 2023-11-23 DIAGNOSIS — R809 Proteinuria, unspecified: Secondary | ICD-10-CM

## 2023-11-23 NOTE — Progress Notes (Signed)
 Patient for US  guided Renal Biopsy on Tues 11/24/23, I called and spoke with the patient on the phone and gave pre-procedure instructions. Pt was made aware to be here at 9a, NPO after MN prior to procedure as well as driver post procedure/recovery/discharge. Pt stated understanding.  Called 11/23/23

## 2023-11-23 NOTE — Progress Notes (Signed)
 MRN : [id number]  Nicholas Vazquez. is a 68 y.o. (Dec 19, 1955) male who presents with chief complaint of check circulation.  History of Present Illness:   The patient is seen for evaluation of painful lower extremities. Patient notes the pain is variable and not always associated with activity.  The pain is somewhat consistent day to day occurring on most days.  He describes the pain as a pins-and-needles primarily in the forefoot and toes.  The patient notes the pain also occurs with standing or sitting for long periods.  The extremity pain routinely seems worse as the day wears on. The pain has been progressive over the past several years. The patient states these symptoms are causing a negative impact on quality of life and daily activities which was a factor in the evaluation.  He also has significant varicose veins of the left lower extremity which he states are not particularly painful he does notice a little bit of swelling as the day goes on.   The patient has a history of back problems and DJD of the lumbar and sacral spine.    The patient denies rest pain or dangling of an extremity off the side of the bed during the night for relief. No open wounds or sores at this time.  The patient is also followed for venous insufficiency and varicose veins. The patient notes improvement in their leg pain symptoms when compression is worn.  The pain is lessened with elevation. Graduated compression stockings, Class I (20-30 mmHg), have been worn and the stockings seem to eliminate most of the leg pain. Over-the-counter analgesics  improved the symptoms somewhat. The degree of discomfort is not interfering with daily activities.   No history of DVT or phlebitis. No prior vascular interventions or surgeries.   ABIs performed today Rt=1.19 and Lt=1.17 (Triphasic signals bilaterally).  Previous ABI's Rt=1.14 and Lt=1.16  (Triphasic signals bilaterally)  Current Meds  Medication Sig   brimonidine  (ALPHAGAN ) 0.2 % ophthalmic solution Place 1 drop into the left eye 2 (two) times daily.    Dorzolamide  HCl-Timolol  Mal PF 22.3-6.8 MG/ML SOLN Place 1 drop into both eyes 2 (two) times daily.    gabapentin (NEURONTIN) 100 MG capsule Take 200 mg by mouth 3 (three) times daily as needed.   hydroxychloroquine  (PLAQUENIL ) 200 MG tablet Take 200 mg by mouth 2 (two) times daily. Sulfate   JARDIANCE 25 MG TABS tablet Take 25 mg by mouth daily.   lansoprazole (PREVACID) 30 MG capsule Take 30 mg by mouth daily.   latanoprost  (XALATAN ) 0.005 % ophthalmic solution Place 1 drop into both eyes at bedtime.    losartan (COZAAR) 100 MG tablet Take 100 mg by mouth daily.   metFORMIN (GLUCOPHAGE-XR) 500 MG 24 hr tablet SMARTSIG:2 Tablet(s) By Mouth Every Evening   metoprolol  tartrate (LOPRESSOR ) 100 MG tablet Take 1 tablet (100mg ) TWO hours prior to CT scan   OVER THE COUNTER MEDICATION Take 1 capsule by mouth in the morning and at bedtime. Bladder 2.2   OVER THE COUNTER MEDICATION Take 1 capsule by mouth daily. Theracran   rosuvastatin (  CRESTOR) 20 MG tablet Take 1 tablet by mouth daily.   solifenacin  (VESICARE ) 10 MG tablet Take 1 tablet (10 mg total) by mouth daily.   spironolactone (ALDACTONE) 25 MG tablet Take by mouth.   tadalafil  (CIALIS ) 10 MG tablet Take 1-2 tablets (10-20 mg total) by mouth daily as needed for erectile dysfunction (take 1 hour prior to sexual activity).   tamsulosin  (FLOMAX ) 0.4 MG CAPS capsule Take 1 capsule (0.4 mg total) by mouth daily.    Past Medical History:  Diagnosis Date   Abnormal ultrasound of prostate November 07, 2014   Acute back pain with sciatica    Midline Low back- Right-side   Arthritis    Lumbosacral DDD and  Rheumatoid   Cancer (HCC) 2017   PROSTATE   Chronic kidney disease    H/O KIDNEY STONES   Collagen vascular disease    Lupus   Diabetes mellitus without complication (HCC)     Type II; pt stated it was due to steroid injections he was given-GLUCOSE NORMALIZED AFTER THAT   Enlarged prostate    GERD (gastroesophageal reflux disease)    Glaucoma    Bilateral   History of kidney stones    Hypertension    Lupus     Past Surgical History:  Procedure Laterality Date   ABDOMINAL EXPOSURE N/A 11/09/2014   Procedure: ABDOMINAL EXPOSURE;  Surgeon: Lonni GORMAN Blade, MD;  Location: [facility] OR;  Service: Vascular;  Laterality: N/A;   ANTERIOR LUMBAR FUSION N/A 11/09/2014   Procedure: ANTERIOR LUMBAR FUSION 1 LEVEL L5 - S1;  Surgeon: Donaciano Sprang, MD;  Location: [facility] OR;  Service: Orthopedics;  Laterality: N/A;   COLONOSCOPY W/ POLYPECTOMY     EXTRACORPOREAL SHOCK WAVE LITHOTRIPSY Right 11/20/2016   Procedure: EXTRACORPOREAL SHOCK WAVE LITHOTRIPSY (ESWL);  Surgeon: Kassie Ozell SAUNDERS, MD;  Location: [facility] ORS;  Service: Urology;  Laterality: Right;   EXTRACORPOREAL SHOCK WAVE LITHOTRIPSY Right 12/04/2016   Procedure: EXTRACORPOREAL SHOCK WAVE LITHOTRIPSY (ESWL);  Surgeon: Kassie Ozell SAUNDERS, MD;  Location: [facility] ORS;  Service: Urology;  Laterality: Right;   FOOT SURGERY Bilateral    GREEN LIGHT LASER TURP (TRANSURETHRAL RESECTION OF PROSTATE N/A 01/30/2015   Procedure: GREEN LIGHT LASER TURP (TRANSURETHRAL RESECTION OF PROSTATE;  Surgeon: Ozell SAUNDERS Kassie, MD;  Location: [facility] ORS;  Service: Urology;  Laterality: N/A;   LEFT HEART CATH AND CORONARY ANGIOGRAPHY Left 05/09/2020   Procedure: LEFT HEART CATH AND CORONARY ANGIOGRAPHY;  Surgeon: Florencio Cara BIRCH, MD;  Location: [facility] INVASIVE CV LAB;  Service: Cardiovascular;  Laterality: Left;   PROSTATE BIOPSY  October 2016   TRANSURETHRAL RESECTION OF BLADDER TUMOR N/A 02/24/2017   Procedure: TRANSURETHRAL RESECTION OF BLADDER TUMOR (TURBT);  Surgeon: Kassie Ozell SAUNDERS, MD;  Location: [facility] ORS;  Service: Urology;  Laterality: N/A;   TUMOR REMOVAL      Social History Social History   Tobacco Use   Smoking status: Former    Current  packs/day: 0.00    Types: Cigarettes    Quit date: 01/21/1994    Years since quitting: 29.8    Passive exposure: Past   Smokeless tobacco: Never   Tobacco comments:    SOCIAL SMOKER ONLY  Vaping Use   Vaping status: Never Used  Substance Use Topics   Alcohol use: No    Alcohol/week: 0.0 standard drinks of alcohol   Drug use: No    Family History Family History  Problem Relation Age of Onset   Diabetes Mother    Varicose Veins Mother  Cancer Father    Breast cancer Neg Hx     Allergies  Allergen Reactions   Ciprofloxacin  Itching    Cipro    Levofloxacin  Itching and Other (See Comments)    Other reaction(s): Other (See Comments)  Extreme itching and his eyes felt like they had sand in them.   Sulfa Antibiotics Other (See Comments)    FATIGUE     REVIEW OF SYSTEMS (Negative unless checked)  Constitutional: [] Weight loss  [] Fever  [] Chills Cardiac: [] Chest pain   [] Chest pressure   [] Palpitations   [] Shortness of breath when laying flat   [] Shortness of breath with exertion. Vascular:  [x] Pain in legs with walking   [] Pain in legs at rest  [] History of DVT   [] Phlebitis   [] Swelling in legs   [] Varicose veins   [] Non-healing ulcers Pulmonary:   [] Uses home oxygen   [] Productive cough   [] Hemoptysis   [] Wheeze  [] COPD   [] Asthma Neurologic:  [] Dizziness   [] Seizures   [] History of stroke   [] History of TIA  [] Aphasia   [] Vissual changes   [] Weakness or numbness in arm   [] Weakness or numbness in leg Musculoskeletal:   [] Joint swelling   [x] Joint pain   [x] Low back pain Hematologic:  [] Easy bruising  [] Easy bleeding   [] Hypercoagulable state   [] Anemic Gastrointestinal:  [] Diarrhea   [] Vomiting  [] Gastroesophageal reflux/heartburn   [] Difficulty swallowing. Genitourinary:  [] Chronic kidney disease   [] Difficult urination  [] Frequent urination   [] Blood in urine Skin:  [] Rashes   [] Ulcers  Psychological:  [] History of anxiety   []  History of major depression.  Physical  Examination  Vitals:   11/23/23 1022  BP: 110/69  Pulse: 78  Resp: 17  Weight: 210 lb (95.3 kg)  Height: 6' (1.829 m)   Body mass index is 28.48 kg/m. Gen: WD/WN, NAD Head: [place]/AT, No temporalis wasting.  Ear/Nose/Throat: Hearing grossly intact, nares w/o erythema or drainage Eyes: PER, EOMI, sclera nonicteric.  Neck: Supple, no masses.  No bruit or JVD.  Pulmonary:  Good air movement, no audible wheezing, no use of accessory muscles.  Cardiac: RRR, normal S1, S2, no Murmurs. Vascular: No significant trophic changes, no open wounds  Large varicosities present, greater than 10 mm left leg much worse than right leg.  Veins are tender to palpation  Mild venous stasis changes to the legs bilaterally.  Trace soft pitting edema CEAP [id number]  Vessel Right Left  Radial Palpable Palpable  PT Not Palpable Not Palpable  DP Not Palpable Not Palpable  Gastrointestinal: soft, non-distended. No guarding/no peritoneal signs.  Musculoskeletal: M/S 5/5 throughout.  No visible deformity.  Neurologic: CN 2-12 intact. Pain and light touch intact in extremities.  Symmetrical.  Speech is fluent. Motor exam as listed above. Psychiatric: Judgment intact, Mood & affect appropriate for pt's clinical situation. Dermatologic: No rashes or ulcers noted.  No changes consistent with cellulitis.   CBC Lab Results  Component Value Date   WBC 8.0 11/13/2018   HGB 11.3 (L) 11/13/2018   HCT 34.3 (L) 11/13/2018   MCV 95.0 11/13/2018   PLT 235 11/13/2018    BMET    Component Value Date/Time   NA 141 11/13/2018 0643   NA 144 04/13/2011 1743   K 4.0 11/13/2018 0643   K 3.5 04/13/2011 1743   CL 109 11/13/2018 0643   CL 107 04/13/2011 1743   CO2 23 11/13/2018 0643   CO2 28 04/13/2011 1743   GLUCOSE 134 (H) 11/13/2018 9356  GLUCOSE 94 04/13/2011 1743   BUN 15 11/13/2018 0643   BUN 12 04/13/2011 1743   CREATININE 1.10 12/28/2018 1028   CREATININE 1.06 04/13/2011 1743   CALCIUM 8.8 (L) 11/13/2018 0643    CALCIUM 8.7 04/13/2011 1743   GFRNONAA >60 11/13/2018 0643   GFRNONAA >60 04/13/2011 1743   GFRAA >60 11/13/2018 0643   GFRAA >60 04/13/2011 1743   CrCl cannot be calculated (Patient's most recent lab result is older than the maximum 21 days allowed.).  COAG No results found for: INR, PROTIME  Radiology No results found.   Assessment/Plan 1. PAD (peripheral artery disease) (Primary)  Recommend:  The patient has evidence of atherosclerosis of the lower extremities with claudication.  The patient does not voice lifestyle limiting changes at this point in time.  Noninvasive studies do not suggest clinically significant change.  No invasive studies, angiography or surgery at this time The patient should continue walking and begin a more formal exercise program.  The patient should continue antiplatelet therapy and aggressive treatment of the lipid abnormalities  No changes in the patient's medications at this time  Continued surveillance is indicated as atherosclerosis is likely to progress with time.    The patient will continue follow up with noninvasive studies as ordered.  - VAS US  ABI WITH/WO TBI; Future  2. Varicose veins with inflammation Recommend:   The patient is complaining of varicose veins.     I have had a long discussion with the patient regarding  varicose veins and why they cause symptoms.  Patient will begin wearing graduated compression stockings on a daily basis, beginning first thing in the morning and removing them in the evening. The patient is instructed specifically not to sleep in the stockings.     The patient  will also begin using over-the-counter analgesics such as Motrin 600 mg po TID to help control the symptoms as needed.     In addition, behavioral modification including elevation during the day will be initiated, utilizing a recliner was recommended.  The patient is also instructed to continue exercising such as walking 4-5 times per  week.   At this time the patient wishes to continue conservative therapy and is not interested in more invasive treatments such as laser ablation and sclerotherapy.  3. Type 2 diabetes mellitus with chronic kidney disease, without long-term current use of insulin, unspecified CKD stage (HCC) Continue hypoglycemic medications as already ordered, these medications have been reviewed and there are no changes at this time.  Hgb A1C to be monitored as already arranged by primary service  4. Back pain with sciatica Continue medications to treat the patient's degenerative disease as already ordered, these medications have been reviewed and there are no changes at this time.  Continued activity and therapy was stressed.    Cordella Shawl, MD  11/23/2023 10:24 AM

## 2023-11-24 ENCOUNTER — Ambulatory Visit
Admission: RE | Admit: 2023-11-24 | Discharge: 2023-11-24 | Disposition: A | Discharge status: Home / Self Care | Source: Ambulatory Visit | Attending: Nephrology | Admitting: Nephrology

## 2023-11-24 DIAGNOSIS — Z8546 Personal history of malignant neoplasm of prostate: Secondary | ICD-10-CM | POA: Insufficient documentation

## 2023-11-24 DIAGNOSIS — N179 Acute kidney failure, unspecified: Secondary | ICD-10-CM | POA: Diagnosis not present

## 2023-11-24 DIAGNOSIS — R809 Proteinuria, unspecified: Secondary | ICD-10-CM | POA: Diagnosis not present

## 2023-11-24 DIAGNOSIS — E1129 Type 2 diabetes mellitus with other diabetic kidney complication: Secondary | ICD-10-CM

## 2023-11-24 DIAGNOSIS — I10 Essential (primary) hypertension: Secondary | ICD-10-CM | POA: Diagnosis not present

## 2023-11-24 DIAGNOSIS — M3214 Glomerular disease in systemic lupus erythematosus: Secondary | ICD-10-CM | POA: Diagnosis not present

## 2023-11-24 DIAGNOSIS — Z87891 Personal history of nicotine dependence: Secondary | ICD-10-CM | POA: Diagnosis not present

## 2023-11-24 DIAGNOSIS — E1121 Type 2 diabetes mellitus with diabetic nephropathy: Secondary | ICD-10-CM | POA: Diagnosis not present

## 2023-11-24 DIAGNOSIS — E1169 Type 2 diabetes mellitus with other specified complication: Secondary | ICD-10-CM | POA: Diagnosis not present

## 2023-11-24 DIAGNOSIS — Z7984 Long term (current) use of oral hypoglycemic drugs: Secondary | ICD-10-CM | POA: Insufficient documentation

## 2023-11-24 LAB — CBC
HCT: 38.4 % — ABNORMAL LOW (ref 39.0–52.0)
Hemoglobin: 12.9 g/dL — ABNORMAL LOW (ref 13.0–17.0)
MCH: 31.9 pg (ref 26.0–34.0)
MCHC: 33.6 g/dL (ref 30.0–36.0)
MCV: 95 fL (ref 80.0–100.0)
Platelets: 175 K/uL (ref 150–400)
RBC: 4.04 MIL/uL — ABNORMAL LOW (ref 4.22–5.81)
RDW: 12.6 % (ref 11.5–15.5)
WBC: 5.4 K/uL (ref 4.0–10.5)
nRBC: 0 % (ref 0.0–0.2)

## 2023-11-24 LAB — PROTIME-INR
INR: 1 (ref 0.8–1.2)
Prothrombin Time: 13.6 s (ref 11.4–15.2)

## 2023-11-24 LAB — GLUCOSE, CAPILLARY: Glucose-Capillary: 117 mg/dL — ABNORMAL HIGH (ref 70–99)

## 2023-11-24 MED ORDER — LIDOCAINE HCL (PF) 1 % IJ SOLN
10.0000 mL | Freq: Once | INTRAMUSCULAR | Status: AC
  Administered 2023-11-24: 10 mL via INTRADERMAL
  Filled 2023-11-24: qty 10

## 2023-11-24 MED ORDER — FENTANYL CITRATE (PF) 100 MCG/2ML IJ SOLN
INTRAMUSCULAR | Status: AC | PRN
  Administered 2023-11-24 (×2): 50 ug via INTRAVENOUS

## 2023-11-24 MED ORDER — SODIUM CHLORIDE 0.9 % IV SOLN: INTRAVENOUS | Status: DC

## 2023-11-24 MED ORDER — FENTANYL CITRATE (PF) 100 MCG/2ML IJ SOLN
INTRAMUSCULAR | Status: AC
  Filled 2023-11-24: qty 2

## 2023-11-24 MED ORDER — MIDAZOLAM HCL (PF) 2 MG/2ML IJ SOLN
INTRAMUSCULAR | Status: AC | PRN
  Administered 2023-11-24: 1 mg via INTRAVENOUS
  Administered 2023-11-24: .5 mg via INTRAVENOUS

## 2023-11-24 MED ORDER — MIDAZOLAM HCL 2 MG/2ML IJ SOLN
INTRAMUSCULAR | Status: AC
  Filled 2023-11-24: qty 2

## 2023-11-24 MED ORDER — MIDAZOLAM HCL 5 MG/5ML IJ SOLN
INTRAMUSCULAR | Status: AC | PRN
  Administered 2023-11-24: .5 mg via INTRAVENOUS

## 2023-11-24 NOTE — Progress Notes (Signed)
 Patient clinically stable post US  Renal biopsy per Dr Jenna, tolerated well. Vitals stable pre and post procedure. Denies complaints immediately post procedure.keeping NPO for hour of recovery, prior to discharge. Report given to Grayce Ann Rn post procedure/recovery/12

## 2023-11-24 NOTE — Procedures (Signed)
 Interventional Radiology Procedure Note  Procedure: US  Guided Biopsy of left kidney  Complications: None  Estimated Blood Loss: < 10 mL  Findings: 18 G core biopsy of kidney performed under US  guidance.  2 core samples obtained and sent to Pathology.  Cordella DELENA Banner, MD

## 2023-11-24 NOTE — CV Procedure (Signed)
  Interventional Radiology Procedure Note   Procedure: US  Guided Biopsy of left kidney   Complications: None   Estimated Blood Loss: < 10 mL   Findings: 18 G core biopsy of kidney performed under US  guidance.  2 core samples obtained and sent to Pathology.   Nicholas DELENA Banner, MD

## 2023-11-24 NOTE — H&P (Signed)
 Chief Complaint:  Proteinuria, unspecified SLE  Procedure: Renal biopsy  Referring Provider(s): Dr GORMAN Brought  Supervising Physician: Jenna Hacker  Patient Status: [facility] - Out-pt  History of Present Illness: Nicholas Vazquez. is a 68 y.o. male with a history of SLE, T2DM, HTN, prostate cancer, and persistent proteinuria. Patient has been following with Dr. Brought with [facility]. BUN/Cr, complement levels, and GFR remain within normal limits. ANA positive. Previous renal US  in 2024 was unremarkable. He continues to take losartan, empagliflozin, and spironolactone for HTN and renal function. At his last visit, he endorsed worsening polyuria/nocturia and dysuria. However, given his continued proteinuria despite adequate renal function he has been referred to IR for renal biopsy.   He presents today with his wife. States that he has been doing well overall, though he continues to have urgency, frequency, and nocturia. Denies any dysuria, hematuria, abdominal pain, nausea/vomiting, chest pain, or shortness of breath. Also reports left sided flank pain for the past several months. NPO since midnight. Not on blood thinners. All questions and concerns answered at the bedside.   Patient is Full Code  Past Medical History:  Diagnosis Date   Abnormal ultrasound of prostate November 07, 2014   Acute back pain with sciatica    Midline Low back- Right-side   Arthritis    Lumbosacral DDD and  Rheumatoid   Cancer (HCC) 2017   PROSTATE   Chronic kidney disease    H/O KIDNEY STONES   Collagen vascular disease    Lupus   Diabetes mellitus without complication (HCC)    Type II; pt stated it was due to steroid injections he was given-GLUCOSE NORMALIZED AFTER THAT   Enlarged prostate    GERD (gastroesophageal reflux disease)    Glaucoma    Bilateral   History of kidney stones    Hypertension    Lupus     Past Surgical History:  Procedure Laterality Date   ABDOMINAL EXPOSURE  N/A 11/09/2014   Procedure: ABDOMINAL EXPOSURE;  Surgeon: Lonni GORMAN Blade, MD;  Location: [facility] OR;  Service: Vascular;  Laterality: N/A;   ANTERIOR LUMBAR FUSION N/A 11/09/2014   Procedure: ANTERIOR LUMBAR FUSION 1 LEVEL L5 - S1;  Surgeon: Donaciano Sprang, MD;  Location: [facility] OR;  Service: Orthopedics;  Laterality: N/A;   COLONOSCOPY W/ POLYPECTOMY     EXTRACORPOREAL SHOCK WAVE LITHOTRIPSY Right 11/20/2016   Procedure: EXTRACORPOREAL SHOCK WAVE LITHOTRIPSY (ESWL);  Surgeon: Kassie Ozell SAUNDERS, MD;  Location: [facility] ORS;  Service: Urology;  Laterality: Right;   EXTRACORPOREAL SHOCK WAVE LITHOTRIPSY Right 12/04/2016   Procedure: EXTRACORPOREAL SHOCK WAVE LITHOTRIPSY (ESWL);  Surgeon: Kassie Ozell SAUNDERS, MD;  Location: [facility] ORS;  Service: Urology;  Laterality: Right;   FOOT SURGERY Bilateral    GREEN LIGHT LASER TURP (TRANSURETHRAL RESECTION OF PROSTATE N/A 01/30/2015   Procedure: GREEN LIGHT LASER TURP (TRANSURETHRAL RESECTION OF PROSTATE;  Surgeon: Ozell SAUNDERS Kassie, MD;  Location: [facility] ORS;  Service: Urology;  Laterality: N/A;   LEFT HEART CATH AND CORONARY ANGIOGRAPHY Left 05/09/2020   Procedure: LEFT HEART CATH AND CORONARY ANGIOGRAPHY;  Surgeon: Florencio Cara BIRCH, MD;  Location: [facility] INVASIVE CV LAB;  Service: Cardiovascular;  Laterality: Left;   PROSTATE BIOPSY  October 2016   TRANSURETHRAL RESECTION OF BLADDER TUMOR N/A 02/24/2017   Procedure: TRANSURETHRAL RESECTION OF BLADDER TUMOR (TURBT);  Surgeon: Kassie Ozell SAUNDERS, MD;  Location: [facility] ORS;  Service: Urology;  Laterality: N/A;   TUMOR REMOVAL      Allergies: Ciprofloxacin , Levofloxacin ,  and Sulfa antibiotics  Medications: Prior to Admission medications   Medication Sig Start Date End Date Taking? Authorizing Provider  brimonidine  (ALPHAGAN ) 0.2 % ophthalmic solution Place 1 drop into the left eye 2 (two) times daily.     [provider]  Dorzolamide  HCl-Timolol  Mal PF 22.3-6.8 MG/ML SOLN Place 1 drop into both eyes 2 (two) times  daily.     [provider]  gabapentin (NEURONTIN) 100 MG capsule Take 200 mg by mouth 3 (three) times daily as needed.    [provider]  hydroxychloroquine  (PLAQUENIL ) 200 MG tablet Take 200 mg by mouth 2 (two) times daily. Sulfate 09/30/14   [provider]  JARDIANCE 25 MG TABS tablet Take 25 mg by mouth daily. 02/10/23   [provider]  lansoprazole (PREVACID) 30 MG capsule Take 30 mg by mouth daily.    [provider]  latanoprost  (XALATAN ) 0.005 % ophthalmic solution Place 1 drop into both eyes at bedtime.     [provider]  LINZESS 72 MCG capsule Take 72 mcg by mouth daily.    [provider]  losartan (COZAAR) 100 MG tablet Take 100 mg by mouth daily.    [provider]  metFORMIN (GLUCOPHAGE-XR) 500 MG 24 hr tablet SMARTSIG:2 Tablet(s) By Mouth Every Evening 09/03/21   [provider]  metoprolol  tartrate (LOPRESSOR ) 100 MG tablet Take 1 tablet (100mg ) TWO hours prior to CT scan 05/06/23   Darliss Rogue, MD  OVER THE COUNTER MEDICATION Take 1 capsule by mouth in the morning and at bedtime. Bladder 2.2    [provider]  OVER THE COUNTER MEDICATION Take 1 capsule by mouth daily. Theracran    [provider]  rosuvastatin (CRESTOR) 20 MG tablet Take 1 tablet by mouth daily. 05/15/20   [provider]  solifenacin  (VESICARE ) 10 MG tablet Take 1 tablet (10 mg total) by mouth daily. 03/12/23   Francisca Rogue BROCKS, MD  spironolactone (ALDACTONE) 25 MG tablet Take by mouth. 03/09/23 03/08/24  [provider]  tadalafil  (CIALIS ) 10 MG tablet Take 1-2 tablets (10-20 mg total) by mouth daily as needed for erectile dysfunction (take 1 hour prior to sexual activity). 03/19/22   Francisca Rogue BROCKS, MD  tamsulosin  (FLOMAX ) 0.4 MG CAPS capsule Take 1 capsule (0.4 mg total) by mouth daily. 01/09/23   Vaillancourt, Samantha, PA-C     Family History  Problem Relation Age of Onset   Diabetes Mother     Varicose Veins Mother    Cancer Father    Breast cancer Neg Hx     Social History   Socioeconomic History   Marital status: Married    Spouse name: Not on file   Number of children: Not on file   Years of education: Not on file   Highest education level: Not on file  Occupational History   Not on file  Tobacco Use   Smoking status: Former    Current packs/day: 0.00    Types: Cigarettes    Quit date: 01/21/1994    Years since quitting: 29.8    Passive exposure: Past   Smokeless tobacco: Never   Tobacco comments:    SOCIAL SMOKER ONLY  Vaping Use   Vaping status: Never Used  Substance and Sexual Activity   Alcohol use: No    Alcohol/week: 0.0 standard drinks of alcohol   Drug use: No   Sexual activity: Not on file  Other Topics Concern   Not on file  Social History  Narrative   Not on file   Social Drivers of Health   Financial Resource Strain: Low Risk  (11/02/2023)   Received from [facility]   Overall Financial Resource Strain (CARDIA)    Difficulty of Paying Living Expenses: Not hard at all  Food Insecurity: No Food Insecurity (11/02/2023)   Received from [facility]   Hunger Vital Sign    Within the past 12 months, you worried that your food would run out before you got the money to buy more.: Never true    Within the past 12 months, the food you bought just didn't last and you didn't have money to get more.: Never true  Transportation Needs: No Transportation Needs (11/02/2023)   Received from [facility] - Transportation    In the past 12 months, has lack of transportation kept you from medical appointments or from getting medications?: No    Lack of Transportation (Non-Medical): No  Physical Activity: Not on file  Stress: Not on file  Social Connections: Not on file    Review of Systems  Genitourinary:  Positive for flank pain (dull, left side), frequency and urgency. Negative for  hematuria.  Patient denies any headache, chest pain, shortness of breath, abdominal pain, N/V, or fever/chills. All other systems are negative.   Vital Signs: BP 124/79   Pulse 72   Temp (!) 97.4 F (36.3 C) (Tympanic)   Resp 20   Ht 6' (1.829 m)   Wt 210 lb (95.3 kg)   SpO2 98%   BMI 28.48 kg/m    Physical Exam Vitals reviewed.  Constitutional:      Appearance: Normal appearance.  HENT:     Mouth/Throat:     Mouth: Mucous membranes are moist.     Pharynx: Oropharynx is clear.  Cardiovascular:     Rate and Rhythm: Normal rate and regular rhythm.     Heart sounds: Normal heart sounds.  Pulmonary:     Effort: Pulmonary effort is normal.     Breath sounds: Normal breath sounds.  Abdominal:     General: Abdomen is flat.     Palpations: Abdomen is soft.     Tenderness: There is no abdominal tenderness. There is no right CVA tenderness or left CVA tenderness.  Skin:    General: Skin is warm and dry.  Neurological:     Mental Status: He is alert and oriented to person, place, and time.  Psychiatric:        Behavior: Behavior normal.     Imaging: No results found.  Labs:  CBC: Recent Labs    11/24/23 0933  WBC 5.4  HGB 12.9*  HCT 38.4*  PLT 175    COAGS: No results for input(s): INR, APTT in the last 8760 hours.  BMP: No results for input(s): NA, K, CL, CO2, GLUCOSE, BUN, CALCIUM, CREATININE, GFRNONAA, GFRAA in the last 8760 hours.  Invalid input(s): CMP  LIVER FUNCTION TESTS: No results for input(s): BILITOT, AST, ALT, ALKPHOS, PROT, ALBUMIN  in the last 8760 hours.  TUMOR MARKERS: No results for input(s): AFPTM, CEA, CA199, CHROMGRNA in the last 8760 hours.  Assessment and Plan:  SLE Glomerulonephritis: Atari Novick. is a 68 y.o. male with a history of SLE, T2DM, HTN, prostate cancer, and persistent proteinuria who presents to [facility] Interventional Radiology department for an image-guided renal  biopsy with Dr. KANDICE Banner. Procedure to be performed under moderate sedation.  -NPO since midnight -Not  on blood thinners  -INR pending -Plan for renal biopsy in ultrasound with Dr. KANDICE Banner  Risks and benefits of renal biopsy was discussed with the patient and/or patient's family including, but not limited to bleeding, infection, damage to adjacent structures or low yield requiring additional tests.  All of the questions were answered and there is agreement to proceed.  Consent signed and in chart.   Thank you for allowing our service to participate in Benjiman Sedgwick. 's care.    Electronically Signed: Jantzen Pilger M Scottlyn Mchaney, PA-C   11/24/2023, 10:06 AM     I spent a total of  30 Minutes in face to face in clinical consultation, greater than 50% of which was counseling/coordinating care for renal biopsy.

## 2023-11-25 LAB — VAS US ABI WITH/WO TBI
Left ABI: 1.47
Right ABI: 1.19

## 2023-11-26 ENCOUNTER — Encounter: Payer: Self-pay | Admitting: Nephrology

## 2023-11-26 ENCOUNTER — Ambulatory Visit: Admitting: Podiatry

## 2023-11-26 ENCOUNTER — Encounter: Payer: Self-pay | Admitting: Podiatry

## 2023-11-26 DIAGNOSIS — M79675 Pain in left toe(s): Secondary | ICD-10-CM | POA: Diagnosis not present

## 2023-11-26 DIAGNOSIS — M3214 Glomerular disease in systemic lupus erythematosus: Secondary | ICD-10-CM | POA: Insufficient documentation

## 2023-11-26 DIAGNOSIS — M79674 Pain in right toe(s): Secondary | ICD-10-CM

## 2023-11-26 DIAGNOSIS — R809 Proteinuria, unspecified: Secondary | ICD-10-CM | POA: Insufficient documentation

## 2023-11-26 DIAGNOSIS — B351 Tinea unguium: Secondary | ICD-10-CM

## 2023-11-26 DIAGNOSIS — L84 Corns and callosities: Secondary | ICD-10-CM | POA: Diagnosis not present

## 2023-11-26 DIAGNOSIS — E1151 Type 2 diabetes mellitus with diabetic peripheral angiopathy without gangrene: Secondary | ICD-10-CM

## 2023-11-26 DIAGNOSIS — E785 Hyperlipidemia, unspecified: Secondary | ICD-10-CM | POA: Insufficient documentation

## 2023-11-26 DIAGNOSIS — I10 Essential (primary) hypertension: Secondary | ICD-10-CM | POA: Insufficient documentation

## 2023-11-26 LAB — SURGICAL PATHOLOGY

## 2023-11-26 NOTE — Progress Notes (Signed)
 Subjective:  Patient ID: Nicholas GORMAN Eva Mickey., male    DOB: Sep 23, 1955,  MRN: [id number]  Nicholas GORMAN Eva Mickey. presents to clinic today for at risk foot care. Pt has h/o NIDDM with PAD and callus(es) of both feet and painful mycotic toenails that are difficult to trim. Painful toenails interfere with ambulation. Aggravating factors include wearing enclosed shoe gear. Pain is relieved with periodic professional debridement. Painful calluses are aggravated when weightbearing with and without shoegear. Pain is relieved with periodic professional debridement.  Chief Complaint  Patient presents with   Nail Problem    Thick painful toenails, 3 month follow up    Diabetes    A1C 5.9   Callouses    At risk foot care - pre-ulcerative callus bilateral   New problem(s): None.   PCP is Nicholas Vazquez ORN, MD. Nicholas Vazquez 11/02/23.  Allergies  Allergen Reactions   Ciprofloxacin  Itching    Cipro    Levofloxacin  Itching and Other (See Comments)    Other reaction(s): Other (See Comments)  Extreme itching and his eyes felt like they had sand in them.   Sulfa Antibiotics Other (See Comments)    FATIGUE    Review of Systems: Negative except as noted in the HPI.  Objective: No changes noted in today's physical examination. There were no vitals filed for this visit. Nicholas Watling. is a pleasant 68 y.o. male in NAD. AAO x 3.  Vascular Examination: CFT <4 seconds b/l LE. Diminished pedal pulses b/l LE. Pedal hair absent. No pain with calf compression b/l. Lower extremity skin temperature gradient within normal limits. Trace edema noted BLE. Varicosities present b/l. Evidence of chronic venous insufficiency b/l LE.  Neurological Examination: Sensation grossly intact b/l with 10 gram monofilament. Vibratory sensation intact b/l.   Dermatological Examination: Pedal skin with normal turgor, texture and tone b/l.  No open wounds. No interdigital macerations.   Toenails 1-5 b/l thick,  discolored, elongated with subungual debris and pain on dorsal palpation.   Hyperkeratotic lesion(s) medial IPJ of left great toe, medial IPJ of right great toe, submet head 4 right foot, and submet head 5 right foot.  No erythema, no edema, no drainage, no fluctuance.  Musculoskeletal Examination: Muscle strength 5/5 to all lower extremity muscle groups bilaterally. Limited joint ROM to the 1st MPJ b/l. Patient ambulates independent of any assistive aids.  Radiographs: None  Assessment/Plan: 1. Pain due to onychomycosis of toenails of both feet   2. Callus   3. Type II diabetes mellitus with peripheral circulatory disorder [facility])   Consent given for treatment. Diabetic foot examination performed. All patient's and/or POA's questions/concerns addressed on today's visit. Mycotic toenails 1-5 b/l debrided in length and girth without incident. Callus(es) medial IPJ of left great toe, medial IPJ of right great toe, submet head 4 right foot, and submet head 5 right foot pared with sharp debridement without incident. Continue foot and shoe inspections daily. Monitor blood glucose per PCP/Endocrinologist's recommendations.Continue soft, supportive shoe gear daily. Report any pedal injuries to medical professional. Call office if there are any quesitons/concerns.  Return in about 3 months (around 02/26/2024).  Nicholas Vazquez, DPM      [place] LOCATION: [address]                                                 [place], [place]  [address]                   Office [phone]   [facility] LOCATION: [address] [place], [place] [address] Office [phone]

## 2023-12-12 ENCOUNTER — Encounter (INDEPENDENT_AMBULATORY_CARE_PROVIDER_SITE_OTHER): Payer: Self-pay | Admitting: Vascular Surgery

## 2024-01-09 ENCOUNTER — Ambulatory Visit
Admission: EM | Admit: 2024-01-09 | Discharge: 2024-01-09 | Disposition: A | Discharge status: Home / Self Care | Attending: Emergency Medicine | Admitting: Emergency Medicine

## 2024-01-09 ENCOUNTER — Encounter: Payer: Self-pay | Admitting: Emergency Medicine

## 2024-01-09 DIAGNOSIS — Z20828 Contact with and (suspected) exposure to other viral communicable diseases: Secondary | ICD-10-CM | POA: Diagnosis not present

## 2024-01-09 DIAGNOSIS — B349 Viral infection, unspecified: Secondary | ICD-10-CM | POA: Diagnosis not present

## 2024-01-09 LAB — POCT INFLUENZA A/B
Influenza A, POC: NEGATIVE
Influenza B, POC: NEGATIVE

## 2024-01-09 LAB — POC SOFIA SARS ANTIGEN FIA: SARS Coronavirus 2 Ag: NEGATIVE

## 2024-01-09 MED ORDER — OSELTAMIVIR PHOSPHATE 75 MG PO CAPS: 75.0000 mg | ORAL_CAPSULE | Freq: Two times a day (BID) | ORAL | 0 refills | Status: AC

## 2024-01-09 NOTE — Discharge Instructions (Signed)
 Your symptoms today are most likely being caused by a virus and should steadily improve in time it can take up to 7 to 10 days before you truly start to see a turnaround however things will get better  COVID and flu testing negative  While it is possible that you were exposed to another germ as you had a flu exposure within your home as a precaution you will be started on Tamiflu  to take twice daily for 5 days to reduce the amount of germs in the body    You can take Tylenol  and/or Ibuprofen as needed for fever reduction and pain relief.   For cough: honey 1/2 to 1 teaspoon (you can dilute the honey in water or another fluid).  You can also use guaifenesin and dextromethorphan for cough. You can use a humidifier for chest congestion and cough.  If you don't have a humidifier, you can sit in the bathroom with the hot shower running.      For sore throat: try warm salt water gargles, cepacol lozenges, throat spray, warm tea or water with lemon/honey, popsicles or ice, or OTC cold relief medicine for throat discomfort.   For congestion: take a daily anti-histamine like Zyrtec, Claritin, and a oral decongestant, such as pseudoephedrine.  You can also use Flonase 1-2 sprays in each nostril daily.   It is important to stay hydrated: drink plenty of fluids (water, gatorade/powerade/pedialyte, juices, or teas) to keep your throat moisturized and help further relieve irritation/discomfort.

## 2024-01-09 NOTE — ED Provider Notes (Signed)
 " Nicholas Vazquez    CSN: [id number] Arrival date & time: 01/09/24  9175      History   Chief Complaint Chief Complaint  Patient presents with   Nasal Congestion   Generalized Body Aches    HPI Nicholas Vazquez. is a 69 y.o. male.   Patient presents for evaluation of nasal congestion present for 3 days, experiencing bodyaches today.  Has not attempted treatment.  Tolerable to food and liquids.  Exposure to influenza A within household.   Past Medical History:  Diagnosis Date   Abnormal ultrasound of prostate November 07, 2014   Acute back pain with sciatica    Midline Low back- Right-side   Arthritis    Lumbosacral DDD and  Rheumatoid   Cancer (HCC) 2017   PROSTATE   Chronic kidney disease    H/O KIDNEY STONES   Collagen vascular disease    Lupus   Diabetes mellitus without complication (HCC)    Type II; pt stated it was due to steroid injections he was given-GLUCOSE NORMALIZED AFTER THAT   Enlarged prostate    GERD (gastroesophageal reflux disease)    Glaucoma    Bilateral   History of kidney stones    Hypertension    Lupus     Patient Active Problem List   Diagnosis Date Noted   Benign essential hypertension 11/26/2023   Hyperlipidemia 11/26/2023   Proteinuria 11/26/2023   SLE glomerulonephritis syndrome (HCC) 11/26/2023   Pain due to onychomycosis of toenails of both feet 11/26/2023   Type II diabetes mellitus with peripheral circulatory disorder (HCC) 11/26/2023   Diabetes (HCC) 05/18/2023   Leg pain 05/18/2023   Varicose veins with inflammation 05/18/2023   PAD (peripheral artery disease) 05/17/2023   Nasal congestion 10/17/2021   Hypercholesterolemia 05/13/2021   Callus 01/05/2020   Anal skin tag 11/18/2018   Grade I hemorrhoids 11/18/2018   Headache disorder 03/30/2018   Carcinoma of prostate (HCC) 03/23/2018   Hypertensive disorder 03/23/2018   OA (osteoarthritis) 01/13/2017   Cancer of prostate (HCC) 03/27/2015   Health care  maintenance 03/27/2015   Back pain with sciatica 11/09/2014   Glaucoma 04/11/2013   Iritis 04/11/2013   Kidney stones 04/11/2013   Lipoma of scalp 04/11/2013   Lipoma of shoulder 04/11/2013   Lupus 04/11/2013   Type 2 diabetes mellitus with chronic kidney disease (HCC) 04/11/2013    Past Surgical History:  Procedure Laterality Date   ABDOMINAL EXPOSURE N/A 11/09/2014   Procedure: ABDOMINAL EXPOSURE;  Surgeon: Lonni GORMAN Blade, MD;  Location: [facility] OR;  Service: Vascular;  Laterality: N/A;   ANTERIOR LUMBAR FUSION N/A 11/09/2014   Procedure: ANTERIOR LUMBAR FUSION 1 LEVEL L5 - S1;  Surgeon: Donaciano Sprang, MD;  Location: [facility] OR;  Service: Orthopedics;  Laterality: N/A;   COLONOSCOPY W/ POLYPECTOMY     EXTRACORPOREAL SHOCK WAVE LITHOTRIPSY Right 11/20/2016   Procedure: EXTRACORPOREAL SHOCK WAVE LITHOTRIPSY (ESWL);  Surgeon: Kassie Ozell SAUNDERS, MD;  Location: [facility] ORS;  Service: Urology;  Laterality: Right;   EXTRACORPOREAL SHOCK WAVE LITHOTRIPSY Right 12/04/2016   Procedure: EXTRACORPOREAL SHOCK WAVE LITHOTRIPSY (ESWL);  Surgeon: Kassie Ozell SAUNDERS, MD;  Location: [facility] ORS;  Service: Urology;  Laterality: Right;   FOOT SURGERY Bilateral    GREEN LIGHT LASER TURP (TRANSURETHRAL RESECTION OF PROSTATE N/A 01/30/2015   Procedure: GREEN LIGHT LASER TURP (TRANSURETHRAL RESECTION OF PROSTATE;  Surgeon: Ozell SAUNDERS Kassie, MD;  Location: [facility] ORS;  Service: Urology;  Laterality: N/A;   LEFT HEART CATH AND  CORONARY ANGIOGRAPHY Left 05/09/2020   Procedure: LEFT HEART CATH AND CORONARY ANGIOGRAPHY;  Surgeon: Florencio Cara BIRCH, MD;  Location: [facility] INVASIVE CV LAB;  Service: Cardiovascular;  Laterality: Left;   PROSTATE BIOPSY  October 2016   TRANSURETHRAL RESECTION OF BLADDER TUMOR N/A 02/24/2017   Procedure: TRANSURETHRAL RESECTION OF BLADDER TUMOR (TURBT);  Surgeon: Kassie Ozell SAUNDERS, MD;  Location: [facility] ORS;  Service: Urology;  Laterality: N/A;   TUMOR REMOVAL         Home Medications    Prior to  Admission medications  Medication Sig Start Date End Date Taking? Authorizing Provider  dorzolamide -timolol  (COSOPT ) 2-0.5 % ophthalmic solution Place 1 drop into both eyes 2 (two) times daily. 12/28/23  Yes [provider]  losartan (COZAAR) 50 MG tablet Take 50 mg by mouth daily. 11/25/23  Yes [provider]  brimonidine  (ALPHAGAN ) 0.2 % ophthalmic solution Place 1 drop into the left eye 2 (two) times daily.     [provider]  Dorzolamide  HCl-Timolol  Mal PF 22.3-6.8 MG/ML SOLN Place 1 drop into both eyes 2 (two) times daily.     [provider]  gabapentin (NEURONTIN) 100 MG capsule Take 200 mg by mouth 3 (three) times daily as needed.    [provider]  hydroxychloroquine  (PLAQUENIL ) 200 MG tablet Take 200 mg by mouth 2 (two) times daily. Sulfate 09/30/14   [provider]  JARDIANCE 25 MG TABS tablet Take 25 mg by mouth daily. 02/10/23   [provider]  lactulose (CHRONULAC) 10 GM/15ML solution TAKE 15 MLS BY MOUTH ONCE DAILY AS NEEDED FOR CONSTIPATION FOR UP TO 90 DAYS 09/22/23   [provider]  lansoprazole (PREVACID) 30 MG capsule Take 30 mg by mouth daily.    [provider]  latanoprost  (XALATAN ) 0.005 % ophthalmic solution Place 1 drop into both eyes at bedtime.     [provider]  LINZESS 72 MCG capsule Take 72 mcg by mouth daily.    [provider]  losartan (COZAAR) 100 MG tablet Take 100 mg by mouth daily.    [provider]  metFORMIN (GLUCOPHAGE-XR) 500 MG 24 hr tablet SMARTSIG:2 Tablet(s) By Mouth Every Evening 09/03/21   [provider]  metoprolol  tartrate (LOPRESSOR ) 100 MG tablet Take 1 tablet (100mg ) TWO hours prior to CT scan 05/06/23   Darliss Rogue, MD  Na Sulfate-K Sulfate-Mg Sulfate concentrate (SUPREP) 17.5-3.13-1.6 GM/177ML SOLN as directed. 08/06/23   [provider]  OVER THE COUNTER MEDICATION Take 1 capsule by mouth in the morning and at  bedtime. Bladder 2.2    [provider]  OVER THE COUNTER MEDICATION Take 1 capsule by mouth daily. Theracran    [provider]  propranolol (INDERAL) 40 MG tablet Take 40 mg by mouth 2 (two) times daily. 05/19/23   [provider]  rosuvastatin (CRESTOR) 20 MG tablet Take 1 tablet by mouth daily. 05/15/20   [provider]  solifenacin  (VESICARE ) 10 MG tablet Take 1 tablet (10 mg total) by mouth daily. 03/12/23   Francisca Rogue BROCKS, MD  spironolactone (ALDACTONE) 25 MG tablet Take by mouth. 03/09/23 03/08/24  [provider]  tadalafil  (CIALIS ) 10 MG tablet Take 1-2 tablets (10-20 mg total) by mouth daily as needed for erectile dysfunction (take 1 hour prior to sexual activity). 03/19/22   Francisca Rogue BROCKS, MD  tamsulosin  (FLOMAX ) 0.4 MG CAPS capsule Take 1 capsule (0.4 mg total) by mouth daily. 01/09/23   Vaillancourt, Samantha, PA-C  Family History Family History  Problem Relation Age of Onset   Diabetes Mother    Varicose Veins Mother    Cancer Father    Breast cancer Neg Hx     Social History Social History[1]   Allergies   Ciprofloxacin , Levofloxacin , and Sulfa antibiotics   Review of Systems Review of Systems  Constitutional: Negative.   HENT:  Positive for congestion. Negative for dental problem, drooling, ear discharge, ear pain, facial swelling, hearing loss, mouth sores, nosebleeds, postnasal drip, rhinorrhea, sinus pressure, sinus pain, sneezing, sore throat, tinnitus, trouble swallowing and voice change.   Respiratory: Negative.    Cardiovascular: Negative.   Gastrointestinal: Negative.   Musculoskeletal:  Positive for myalgias. Negative for arthralgias, back pain, gait problem, joint swelling, neck pain and neck stiffness.  Skin: Negative.      Physical Exam Triage Vital Signs ED Triage Vitals  Encounter Vitals Group     BP 01/09/24 0950 118/75     Girls Systolic BP Percentile --      Girls Diastolic BP Percentile --       Boys Systolic BP Percentile --      Boys Diastolic BP Percentile --      Pulse Rate 01/09/24 0950 77     Resp 01/09/24 0950 18     Temp 01/09/24 0950 98.6 F (37 C)     Temp Source 01/09/24 0950 Oral     SpO2 01/09/24 0950 95 %     Weight --      Height --      Head Circumference --      Peak Flow --      Pain Score 01/09/24 0954 4     Pain Loc --      Pain Education --      Exclude from Growth Chart --    No data found.  Updated Vital Signs BP 118/75 (BP Location: Right Arm)   Pulse 77   Temp 98.6 F (37 C) (Oral)   Resp 18   SpO2 95%   Visual Acuity Right Eye Distance:   Left Eye Distance:   Bilateral Distance:    Right Eye Near:   Left Eye Near:    Bilateral Near:     Physical Exam Constitutional:      Appearance: Normal appearance.  HENT:     Head: Normocephalic.     Right Ear: Tympanic membrane, ear canal and external ear normal.     Left Ear: Tympanic membrane, ear canal and external ear normal.     Nose: Congestion present.     Mouth/Throat:     Pharynx: No oropharyngeal exudate or posterior oropharyngeal erythema.  Cardiovascular:     Rate and Rhythm: Normal rate and regular rhythm.     Pulses: Normal pulses.     Heart sounds: Normal heart sounds.  Pulmonary:     Effort: Pulmonary effort is normal.     Breath sounds: Normal breath sounds.  Musculoskeletal:     Cervical back: Normal range of motion. Rigidity present.  Neurological:     Mental Status: He is alert and oriented to person, place, and time. Mental status is at baseline.      UC Treatments / Results  Labs (all labs ordered are listed, but only abnormal results are displayed) Labs Reviewed  POCT INFLUENZA A/B  POC SOFIA SARS ANTIGEN FIA    EKG   Radiology No results found.  Procedures Procedures (including critical care time)  Medications Ordered in UC Medications -  No data to display  Initial Impression / Assessment and Plan / UC Course  I have reviewed the triage  vital signs and the nursing notes.  Pertinent labs & imaging results that were available during my care of the patient were reviewed by me and considered in my medical decision making (see chart for details).  Viral illness, exposure to flu  Patient is in no signs of distress nor toxic appearing.  Vital signs are stable.  Low suspicion for pneumonia, pneumothorax or bronchitis and therefore will defer imaging.  COVID and flu testing negative.  Empirically placed on Tamiflu  as exposure is within household.May use additional over-the-counter medications as needed for supportive care.  May follow-up with urgent care as needed if symptoms persist or worsen.   Final Clinical Impressions(s) / UC Diagnoses   Final diagnoses:  Viral illness   Discharge Instructions   None    ED Prescriptions   None    PDMP not reviewed this encounter.     [1]  Social History Tobacco Use   Smoking status: Former    Current packs/day: 0.00    Average packs/day: 0.3 packs/day    Types: Cigarettes    Quit date: 01/21/1994    Years since quitting: 29.9    Passive exposure: Past   Smokeless tobacco: Never   Tobacco comments:    SOCIAL SMOKER ONLY  Vaping Use   Vaping status: Never Used  Substance Use Topics   Alcohol use: No    Alcohol/week: 0.0 standard drinks of alcohol   Drug use: No     Teresa Shelba SAUNDERS, NP 01/09/24 1025  "

## 2024-01-09 NOTE — ED Triage Notes (Signed)
 Patient reports runny nose x 3 days. Patient complains of bodyaches that started today. Patient wife was diagnosed with Flu A on yesterday. Rates bodyaches 4/10. Patient has not taken anything for symptoms.

## 2024-02-26 ENCOUNTER — Ambulatory Visit: Admitting: Podiatry

## 2024-03-09 ENCOUNTER — Ambulatory Visit: Admitting: Urology

## 2024-03-10 ENCOUNTER — Ambulatory Visit: Admitting: Urology

## 2024-11-21 ENCOUNTER — Encounter (INDEPENDENT_AMBULATORY_CARE_PROVIDER_SITE_OTHER)

## 2024-11-21 ENCOUNTER — Ambulatory Visit (INDEPENDENT_AMBULATORY_CARE_PROVIDER_SITE_OTHER): Admitting: Vascular Surgery
